# Patient Record
Sex: Female | Born: 1960
Health system: Southern US, Community
[De-identification: ages and names within clinical notes are randomized; demographics above are authoritative.]

## PROBLEM LIST (undated history)

## (undated) DIAGNOSIS — K5730 Diverticulosis of large intestine without perforation or abscess without bleeding: Secondary | ICD-10-CM

## (undated) DIAGNOSIS — Z860101 Personal history of adenomatous and serrated colon polyps: Secondary | ICD-10-CM

## (undated) DIAGNOSIS — Z86010 Personal history of colonic polyps: Secondary | ICD-10-CM

## (undated) DIAGNOSIS — M8580 Other specified disorders of bone density and structure, unspecified site: Secondary | ICD-10-CM

## (undated) DIAGNOSIS — T7840XA Allergy, unspecified, initial encounter: Secondary | ICD-10-CM

## (undated) DIAGNOSIS — R350 Frequency of micturition: Secondary | ICD-10-CM

## (undated) DIAGNOSIS — E785 Hyperlipidemia, unspecified: Secondary | ICD-10-CM

## (undated) DIAGNOSIS — Z8709 Personal history of other diseases of the respiratory system: Secondary | ICD-10-CM

## (undated) DIAGNOSIS — N393 Stress incontinence (female) (male): Secondary | ICD-10-CM

## (undated) DIAGNOSIS — E079 Disorder of thyroid, unspecified: Secondary | ICD-10-CM

## (undated) DIAGNOSIS — I10 Essential (primary) hypertension: Secondary | ICD-10-CM

## (undated) DIAGNOSIS — E039 Hypothyroidism, unspecified: Secondary | ICD-10-CM

## (undated) DIAGNOSIS — J45909 Unspecified asthma, uncomplicated: Secondary | ICD-10-CM

## (undated) HISTORY — DX: Other specified disorders of bone density and structure, unspecified site: M85.80

## (undated) HISTORY — DX: Unspecified asthma, uncomplicated: J45.909

## (undated) HISTORY — DX: Disorder of thyroid, unspecified: E07.9

## (undated) HISTORY — PX: LAPAROSCOPY: SHX197

## (undated) HISTORY — DX: Hyperlipidemia, unspecified: E78.5

## (undated) HISTORY — DX: Allergy, unspecified, initial encounter: T78.40XA

## (undated) HISTORY — PX: TONSILLECTOMY: SUR1361

## (undated) HISTORY — DX: Essential (primary) hypertension: I10

---
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
## 1997-11-16 HISTORY — PX: TOTAL ABDOMINAL HYSTERECTOMY W/ BILATERAL SALPINGOOPHORECTOMY: SHX83

## 1998-11-16 HISTORY — PX: ABDOMINAL HYSTERECTOMY: SHX81

## 2012-11-16 LAB — HM COLONOSCOPY

## 2014-07-17 LAB — HM PAP SMEAR: HM PAP: NORMAL

## 2014-07-17 LAB — HM MAMMOGRAPHY: HM Mammogram: NORMAL

## 2015-03-08 ENCOUNTER — Telehealth: Payer: Self-pay | Admitting: *Deleted

## 2015-03-08 ENCOUNTER — Encounter: Payer: Self-pay | Admitting: *Deleted

## 2015-03-08 NOTE — Telephone Encounter (Signed)
Pre-Visit Call completed with patient and chart updated.   Pre-Visit Info documented in Specialty Comments under SnapShot.    

## 2015-03-11 ENCOUNTER — Ambulatory Visit (INDEPENDENT_AMBULATORY_CARE_PROVIDER_SITE_OTHER): Payer: BLUE CROSS/BLUE SHIELD | Admitting: Family

## 2015-03-11 ENCOUNTER — Encounter: Payer: Self-pay | Admitting: Family

## 2015-03-11 VITALS — BP 122/82 | HR 79 | Temp 97.9°F | Resp 16 | Ht 61.0 in | Wt 164.0 lb

## 2015-03-11 DIAGNOSIS — Z0000 Encounter for general adult medical examination without abnormal findings: Secondary | ICD-10-CM

## 2015-03-11 DIAGNOSIS — Z23 Encounter for immunization: Secondary | ICD-10-CM

## 2015-03-11 NOTE — Patient Instructions (Addendum)
Please complete lab work prior to leaving. Follow up in 6 months for follow up of your blood pressure. Schedule mole removal at your convenience. Welcome to [facility]!

## 2015-03-11 NOTE — Progress Notes (Signed)
Pre visit review using our clinic review tool, if applicable. No additional management support is needed unless otherwise documented below in the visit note. 

## 2015-03-11 NOTE — Assessment & Plan Note (Signed)
Obtain routine lab work today. EKG, continue healthy diet, exercise. Follow up in 6 months.

## 2015-03-11 NOTE — Progress Notes (Signed)
Subjective:    Patient ID: Tara Hamilton, female    DOB: December 06, 1960, 54 y.o.   MRN: [id number]  HPI  Ms. Tara SheehanJenkins is a 54 yr old female who presents today to establish care.    Patient presents today for complete physical.  Immunizations: >10 years ago.  Due Diet:has gained 10 pounds since October (working from home) Exercise: gym- 1 hour on treadmill, some light weights 3 x a week, enjoys hiking Colonoscopy: 2014- was told diverticulosis, advised to eat more fiber.  Dexa: has not had- on HRT Pap Smear:hysterectomy Mammogram: 9/15 Dental: has apt Wednesday Vision- last eye exam 1/15  Review of Systems  Constitutional: Positive for unexpected weight change.  HENT: Negative for hearing loss and rhinorrhea.   Eyes: Negative for visual disturbance.  Respiratory: Negative for cough.   Cardiovascular: Negative for leg swelling.  Gastrointestinal: Negative for nausea, diarrhea, constipation and blood in stool.  Genitourinary: Negative for dysuria and frequency.       Stress incontinence  Musculoskeletal: Negative for myalgias and arthralgias.  Skin: Negative for rash.  Neurological:       Some allergy headaches  Hematological: Negative for adenopathy.  Psychiatric/Behavioral: Negative for dysphoric mood and agitation.   Past Medical History  Diagnosis Date  . Hypertension   . Allergy     History   Social History  . Marital Status: Divorced    Spouse Name: N/A  . Number of Children: N/A  . Years of Education: N/A   Occupational History  . Not on file.   Social History Main Topics  . Smoking status: Never Smoker   . Smokeless tobacco: Never Used  . Alcohol Use: 0.6 oz/week    1 Glasses of wine per week  . Drug Use: No  . Sexual Activity: Not on file   Other Topics Concern  . Not on file   Social History Narrative    Past Surgical History  Procedure Laterality Date  . Abdominal hysterectomy  2000  . Tonsillectomy      Family History  Problem  Relation Age of Onset  . Hypertension Mother   . Stroke Mother [id number]  . Hypertension Father   . Cancer Maternal Aunt 70    uterine  . Heart disease Paternal Grandmother     Allergies  Allergen Reactions  . Penicillins Swelling    Current Outpatient Prescriptions on File Prior to Visit  Medication Sig Dispense Refill  . Calcium Carbonate-Vitamin D (TGT CALCIUM DIETARY SUPPLEMENT PO) Take 1 tablet by mouth daily.    Marland Kitchen. estropipate (OGEN) 1.5 MG tablet Take 1.5 mg by mouth daily.    . hydrochlorothiazide (MICROZIDE) 12.5 MG capsule Take 12.5 mg by mouth daily.    . Multiple Vitamins-Minerals (MULTIVITAMIN ADULT PO) Take 1 tablet by mouth daily.     No current facility-administered medications on file prior to visit.    BP 122/82 mmHg  Pulse 79  Temp(Src) 97.9 F (36.6 C) (Oral)  Resp 16  Ht 5\' 1"  (1.549 m)  Wt 164 lb (74.39 kg)  BMI 31.00 kg/m2  SpO2 99%       Objective:   Physical Exam  Physical Exam  Constitutional: She is oriented to person, place, and time. She appears well-developed and well-nourished. No distress.  HENT:  Head: Normocephalic and atraumatic.  Right Ear: Tympanic membrane and ear canal normal.  Left Ear: Tympanic membrane and ear canal normal.  Mouth/Throat: Oropharynx is clear and moist.  Eyes: Pupils are equal, round, and  reactive to light. No scleral icterus.  Neck: Normal range of motion. No thyromegaly present.  Cardiovascular: Normal rate and regular rhythm.   No murmur heard. Pulmonary/Chest: Effort normal and breath sounds normal. No respiratory distress. He has no wheezes. She has no rales. She exhibits no tenderness.  Abdominal: Soft. Bowel sounds are normal. He exhibits no distension and no mass. There is no tenderness. There is no rebound and no guarding.  Musculoskeletal: She exhibits no edema.  Lymphadenopathy:    She has no cervical adenopathy.  Neurological: She is alert and oriented to person, place, and time. She has  normalpatellar reflexes. She exhibits normal muscle tone. Coordination normal.  Skin: Skin is warm and dry. raised rough small skin lesion right dorsal wrist. Tanned skin Psychiatric: She has a normal mood and affect. Her behavior is normal. Judgment and thought content normal.  Breasts: Examined lying Right: Without masses, retractions, discharge or axillary adenopathy.  Left: Without masses, retractions, discharge or axillary adenopathy.  Pelvic: deferred          Assessment & Plan:         Assessment & Plan:

## 2015-03-12 LAB — URINALYSIS, ROUTINE W REFLEX MICROSCOPIC
Bilirubin Urine: NEGATIVE
Hgb urine dipstick: NEGATIVE
Ketones, ur: NEGATIVE
Nitrite: NEGATIVE
PH: 7 (ref 5.0–8.0)
Specific Gravity, Urine: 1.015 (ref 1.000–1.030)
Total Protein, Urine: NEGATIVE
UROBILINOGEN UA: 0.2 (ref 0.0–1.0)
Urine Glucose: NEGATIVE

## 2015-03-12 LAB — LIPID PANEL
CHOLESTEROL: 223 mg/dL — AB (ref 0–200)
HDL: 72.5 mg/dL (ref >39.00)
LDL Cholesterol: 124 mg/dL — ABNORMAL HIGH (ref 0–99)
NonHDL: 150.5
Total CHOL/HDL Ratio: 3
Triglycerides: 132 mg/dL (ref 0.0–149.0)
VLDL: 26.4 mg/dL (ref 0.0–40.0)

## 2015-03-12 LAB — CBC WITH DIFFERENTIAL/PLATELET
BASOS ABS: 0 10*3/uL (ref 0.0–0.1)
Basophils Relative: 0.6 % (ref 0.0–3.0)
EOS ABS: 0 10*3/uL (ref 0.0–0.7)
Eosinophils Relative: 0.6 % (ref 0.0–5.0)
HEMATOCRIT: 42 % (ref 36.0–46.0)
Hemoglobin: 14.3 g/dL (ref 12.0–15.0)
Lymphocytes Relative: 27.4 % (ref 12.0–46.0)
Lymphs Abs: 1.9 10*3/uL (ref 0.7–4.0)
MCHC: 34.1 g/dL (ref 30.0–36.0)
MCV: 91.8 fl (ref 78.0–100.0)
MONO ABS: 0.4 10*3/uL (ref 0.1–1.0)
Monocytes Relative: 6 % (ref 3.0–12.0)
NEUTROS PCT: 65.4 % (ref 43.0–77.0)
Neutro Abs: 4.5 10*3/uL (ref 1.4–7.7)
Platelets: 316 10*3/uL (ref 150.0–400.0)
RBC: 4.58 Mil/uL (ref 3.87–5.11)
RDW: 12.7 % (ref 11.5–15.5)
WBC: 6.9 10*3/uL (ref 4.0–10.5)

## 2015-03-12 LAB — BASIC METABOLIC PANEL
BUN: 13 mg/dL (ref 6–23)
CO2: 30 mEq/L (ref 19–32)
Calcium: 10.4 mg/dL (ref 8.4–10.5)
Chloride: 101 mEq/L (ref 96–112)
Creatinine, Ser: 0.76 mg/dL (ref 0.40–1.20)
GFR: 84.75 mL/min (ref >60.00)
GLUCOSE: 63 mg/dL — AB (ref 70–99)
Potassium: 4.1 mEq/L (ref 3.5–5.1)
Sodium: 140 mEq/L (ref 135–145)

## 2015-03-12 LAB — HEPATIC FUNCTION PANEL
ALT: 16 U/L (ref 0–35)
AST: 20 U/L (ref 0–37)
Albumin: 4.5 g/dL (ref 3.5–5.2)
Alkaline Phosphatase: 73 U/L (ref 39–117)
BILIRUBIN DIRECT: 0 mg/dL (ref 0.0–0.3)
Total Bilirubin: 0.3 mg/dL (ref 0.2–1.2)
Total Protein: 7.1 g/dL (ref 6.0–8.3)

## 2015-03-12 LAB — TSH: TSH: 2.71 u[IU]/mL (ref 0.35–4.50)

## 2015-03-13 ENCOUNTER — Encounter: Payer: Self-pay | Admitting: Family

## 2015-03-14 MED ORDER — CIPROFLOXACIN HCL 500 MG PO TABS: 500.0000 mg | ORAL_TABLET | Freq: Two times a day (BID) | ORAL | Status: DC

## 2015-03-18 MED ORDER — CIPROFLOXACIN HCL 500 MG PO TABS: 500.0000 mg | ORAL_TABLET | Freq: Two times a day (BID) | ORAL | Status: DC

## 2015-03-18 NOTE — Addendum Note (Signed)
Addended by: Sandford Craze'SULLIVAN, Seferino Oscar on: 03/18/2015 02:51 PM   Modules accepted: Orders

## 2015-03-28 ENCOUNTER — Telehealth: Payer: Self-pay | Admitting: Family

## 2015-03-28 NOTE — Telephone Encounter (Signed)
Relation to pt: self Call back number: [phone]   Reason for call:  Pt would like a pap 03/29/15, 04/01/15, 04/02/15 or 04/16/15 due to insurance canceling on the 04/17/15.

## 2015-03-28 NOTE — Telephone Encounter (Signed)
Patient has had hysterectomy so Pap is not indicated.

## 2015-03-29 NOTE — Telephone Encounter (Signed)
Notified pt. Her current insurance will be expiring on 04/16/15. Pt would like to get 90 day supply of HCTZ and Ogen to [facility]. Pt states she was told at her last office visit that we would take over management of her HRT. Pt has 6 month follow up in 08/2015. Please advise.

## 2015-03-31 MED ORDER — ESTROPIPATE 1.5 MG PO TABS: 1.5000 mg | ORAL_TABLET | Freq: Every day | ORAL | Status: DC

## 2015-03-31 MED ORDER — HYDROCHLOROTHIAZIDE 12.5 MG PO CAPS: 12.5000 mg | ORAL_CAPSULE | Freq: Every day | ORAL | Status: DC

## 2015-08-20 ENCOUNTER — Encounter: Payer: Self-pay | Admitting: Family

## 2015-08-20 ENCOUNTER — Ambulatory Visit (INDEPENDENT_AMBULATORY_CARE_PROVIDER_SITE_OTHER): Payer: 59 | Admitting: Family

## 2015-08-20 VITALS — BP 130/89 | HR 71 | Temp 98.3°F | Resp 16 | Ht 61.0 in | Wt 162.8 lb

## 2015-08-20 DIAGNOSIS — Z0000 Encounter for general adult medical examination without abnormal findings: Secondary | ICD-10-CM

## 2015-08-20 DIAGNOSIS — I10 Essential (primary) hypertension: Secondary | ICD-10-CM | POA: Insufficient documentation

## 2015-08-20 DIAGNOSIS — N951 Menopausal and female climacteric states: Secondary | ICD-10-CM | POA: Insufficient documentation

## 2015-08-20 DIAGNOSIS — J4520 Mild intermittent asthma, uncomplicated: Secondary | ICD-10-CM

## 2015-08-20 DIAGNOSIS — J45909 Unspecified asthma, uncomplicated: Secondary | ICD-10-CM | POA: Insufficient documentation

## 2015-08-20 LAB — BASIC METABOLIC PANEL
BUN: 16 mg/dL (ref 6–23)
CHLORIDE: 101 meq/L (ref 96–112)
CO2: 33 mEq/L — ABNORMAL HIGH (ref 19–32)
Calcium: 9.8 mg/dL (ref 8.4–10.5)
Creatinine, Ser: 0.84 mg/dL (ref 0.40–1.20)
GFR: 75.38 mL/min (ref >60.00)
Glucose, Bld: 82 mg/dL (ref 70–99)
POTASSIUM: 3.6 meq/L (ref 3.5–5.1)
Sodium: 139 mEq/L (ref 135–145)

## 2015-08-20 MED ORDER — HYDROCHLOROTHIAZIDE 12.5 MG PO CAPS: 12.5000 mg | ORAL_CAPSULE | Freq: Every day | ORAL | Status: DC

## 2015-08-20 MED ORDER — ESTROPIPATE 1.5 MG PO TABS: 1.5000 mg | ORAL_TABLET | Freq: Every day | ORAL | Status: DC

## 2015-08-20 MED ORDER — AZELASTINE HCL 0.1 % NA SOLN: NASAL | Status: DC

## 2015-08-20 MED ORDER — ALBUTEROL SULFATE HFA 108 (90 BASE) MCG/ACT IN AERS
2.0000 | INHALATION_SPRAY | RESPIRATORY_TRACT | Status: DC | PRN
Start: 2015-08-20 — End: 2016-09-09

## 2015-08-20 NOTE — Progress Notes (Signed)
Subjective:    Patient ID: Tara Hamilton, female    DOB: 01-07-1961, 54 y.o.   MRN: [id number]  HPI    HTN- She is maintianed on HCTZ. Denies CP/SOB or swelling.   BP Readings from Last 3 Encounters:  08/20/15 130/89  03/11/15 122/82   Asthma- reports that she used albuterol a few weeks ago due to allergies.   HRT- continues estrogen therapy. Feels well on current dose.   Review of Systems See HPI  Past Medical History  Diagnosis Date  . Hypertension   . Allergy     Social History   Social History  . Marital Status: Divorced    Spouse Name: N/A  . Number of Children: N/A  . Years of Education: N/A   Occupational History  . Not on file.   Social History Main Topics  . Smoking status: Never Smoker   . Smokeless tobacco: Never Used  . Alcohol Use: 0.6 oz/week    1 Glasses of wine per week  . Drug Use: No  . Sexual Activity: Not on file   Other Topics Concern  . Not on file   Social History Narrative   From here, recently moved back from 20 years in [place].    Works as a IT consultant   2 grown sons one in [place], other son lives in [place]   Single    Enjoys Nutritional therapist, church, reading    Past Surgical History  Procedure Laterality Date  . Abdominal hysterectomy  2000  . Tonsillectomy      Family History  Problem Relation Age of Onset  . Hypertension Mother   . Stroke Mother 87  . Hypertension Father   . Cancer Maternal Aunt 70    uterine  . Heart disease Paternal Grandmother     Allergies  Allergen Reactions  . Penicillins Swelling    Current Outpatient Prescriptions on File Prior to Visit  Medication Sig Dispense Refill  . albuterol (PROVENTIL HFA;VENTOLIN HFA) 108 (90 BASE) MCG/ACT inhaler Inhale 2 puffs into the lungs every 4 (four) hours as needed (allergies).    . AZELASTINE HCL NA Place 0.1 % into the nose. Ust two sprays in each nostril twice a day as needed    . Calcium Carbonate-Vitamin D (TGT CALCIUM DIETARY  SUPPLEMENT PO) Take 1 tablet by mouth daily.    Marland Kitchen estropipate (OGEN) 1.5 MG tablet Take 1 tablet (1.5 mg total) by mouth daily. 90 tablet 1  . fexofenadine (ALLEGRA) 180 MG tablet Take 180 mg by mouth daily.    . hydrochlorothiazide (MICROZIDE) 12.5 MG capsule Take 1 capsule (12.5 mg total) by mouth daily. 90 capsule 1  . Multiple Vitamins-Minerals (MULTIVITAMIN ADULT PO) Take 1 tablet by mouth daily.     No current facility-administered medications on file prior to visit.    BP 130/89 mmHg  Pulse 71  Temp(Src) 98.3 F (36.8 C) (Oral)  Resp 16  Ht  (1.549 m)  Wt 162 lb 12.8 oz (73.846 kg)  BMI 30.78 kg/m2  SpO2 98%       Objective:   Physical Exam  Constitutional: She is oriented to person, place, and time. She appears well-developed and well-nourished.  Cardiovascular: Normal rate, regular rhythm and normal heart sounds.   No murmur heard. Pulmonary/Chest: Effort normal and breath sounds normal. No respiratory distress. She has no wheezes.  Musculoskeletal: She exhibits no edema.  Neurological: She is alert and oriented to person, place, and time.  Psychiatric:  She has a normal mood and affect. Her behavior is normal. Judgment and thought content normal.          Assessment & Plan:  Will get flu shot on Tuesday at work.

## 2015-08-20 NOTE — Assessment & Plan Note (Signed)
Stable on ogen, continue same.

## 2015-08-20 NOTE — Progress Notes (Signed)
Pre visit review using our clinic review tool, if applicable. No additional management support is needed unless otherwise documented below in the visit note. 

## 2015-08-20 NOTE — Assessment & Plan Note (Signed)
Stable on hctz, continue same, obtain bmet.  

## 2015-08-20 NOTE — Patient Instructions (Addendum)
Please complete lab work prior to leaving. Follow up in 6 months for annual physical. (after 03/11/15)

## 2015-08-20 NOTE — Assessment & Plan Note (Signed)
Stable with prn use of albuterol.  

## 2015-09-05 ENCOUNTER — Ambulatory Visit: Payer: 59

## 2015-09-11 ENCOUNTER — Ambulatory Visit: Payer: BLUE CROSS/BLUE SHIELD | Admitting: Family

## 2015-09-26 ENCOUNTER — Ambulatory Visit
Admission: RE | Admit: 2015-09-26 | Discharge: 2015-09-26 | Disposition: A | Discharge status: Home / Self Care | Payer: 59 | Source: Ambulatory Visit | Attending: Family | Admitting: Family

## 2015-09-26 DIAGNOSIS — Z0000 Encounter for general adult medical examination without abnormal findings: Secondary | ICD-10-CM

## 2016-03-11 ENCOUNTER — Other Ambulatory Visit (INDEPENDENT_AMBULATORY_CARE_PROVIDER_SITE_OTHER): Payer: 59

## 2016-03-11 ENCOUNTER — Ambulatory Visit (INDEPENDENT_AMBULATORY_CARE_PROVIDER_SITE_OTHER): Payer: 59 | Admitting: Family

## 2016-03-11 ENCOUNTER — Encounter: Payer: Self-pay | Admitting: Family

## 2016-03-11 VITALS — BP 132/84 | Ht 61.0 in | Wt 159.4 lb

## 2016-03-11 DIAGNOSIS — Z0000 Encounter for general adult medical examination without abnormal findings: Secondary | ICD-10-CM

## 2016-03-11 DIAGNOSIS — E039 Hypothyroidism, unspecified: Secondary | ICD-10-CM

## 2016-03-11 LAB — HEPATIC FUNCTION PANEL
ALT: 12 U/L (ref 0–35)
AST: 15 U/L (ref 0–37)
Albumin: 4.2 g/dL (ref 3.5–5.2)
Alkaline Phosphatase: 63 U/L (ref 39–117)
BILIRUBIN DIRECT: 0.1 mg/dL (ref 0.0–0.3)
BILIRUBIN TOTAL: 0.3 mg/dL (ref 0.2–1.2)
Total Protein: 6.8 g/dL (ref 6.0–8.3)

## 2016-03-11 LAB — CBC WITH DIFFERENTIAL/PLATELET
Basophils Absolute: 0 10*3/uL (ref 0.0–0.1)
Basophils Relative: 0.4 % (ref 0.0–3.0)
EOS ABS: 0.1 10*3/uL (ref 0.0–0.7)
Eosinophils Relative: 1.2 % (ref 0.0–5.0)
HCT: 40 % (ref 36.0–46.0)
HEMOGLOBIN: 13.5 g/dL (ref 12.0–15.0)
LYMPHS ABS: 1.4 10*3/uL (ref 0.7–4.0)
Lymphocytes Relative: 28.3 % (ref 12.0–46.0)
MCHC: 33.7 g/dL (ref 30.0–36.0)
MCV: 92.7 fl (ref 78.0–100.0)
MONO ABS: 0.4 10*3/uL (ref 0.1–1.0)
Monocytes Relative: 8 % (ref 3.0–12.0)
NEUTROS PCT: 62.1 % (ref 43.0–77.0)
Neutro Abs: 3 10*3/uL (ref 1.4–7.7)
Platelets: 300 10*3/uL (ref 150.0–400.0)
RBC: 4.32 Mil/uL (ref 3.87–5.11)
RDW: 12.2 % (ref 11.5–15.5)
WBC: 4.9 10*3/uL (ref 4.0–10.5)

## 2016-03-11 LAB — URINALYSIS, ROUTINE W REFLEX MICROSCOPIC
BILIRUBIN URINE: NEGATIVE
HGB URINE DIPSTICK: NEGATIVE
Ketones, ur: NEGATIVE
Nitrite: NEGATIVE
Specific Gravity, Urine: 1.02 (ref 1.000–1.030)
TOTAL PROTEIN, URINE-UPE24: NEGATIVE
Urine Glucose: NEGATIVE
Urobilinogen, UA: 0.2 (ref 0.0–1.0)
pH: 6 (ref 5.0–8.0)

## 2016-03-11 LAB — BASIC METABOLIC PANEL
BUN: 24 mg/dL — AB (ref 6–23)
CALCIUM: 9.8 mg/dL (ref 8.4–10.5)
CO2: 31 mEq/L (ref 19–32)
CREATININE: 0.83 mg/dL (ref 0.40–1.20)
Chloride: 103 mEq/L (ref 96–112)
GFR: 76.27 mL/min (ref >60.00)
GLUCOSE: 103 mg/dL — AB (ref 70–99)
Potassium: 5 mEq/L (ref 3.5–5.1)
Sodium: 141 mEq/L (ref 135–145)

## 2016-03-11 LAB — LIPID PANEL
CHOL/HDL RATIO: 3
CHOLESTEROL: 171 mg/dL (ref 0–200)
HDL: 56.1 mg/dL (ref >39.00)
LDL CALC: 99 mg/dL (ref 0–99)
NONHDL: 114.41
TRIGLYCERIDES: 79 mg/dL (ref 0.0–149.0)
VLDL: 15.8 mg/dL (ref 0.0–40.0)

## 2016-03-11 LAB — TSH: TSH: 4.58 u[IU]/mL — ABNORMAL HIGH (ref 0.35–4.50)

## 2016-03-11 LAB — T3, FREE: T3, Free: 9.1 pg/mL — ABNORMAL HIGH (ref 2.3–4.2)

## 2016-03-11 LAB — T4, FREE: Free T4: 2.49 ng/dL — ABNORMAL HIGH (ref 0.60–1.60)

## 2016-03-11 MED ORDER — HYDROCHLOROTHIAZIDE 12.5 MG PO CAPS: 12.5000 mg | ORAL_CAPSULE | Freq: Every day | ORAL | Status: DC

## 2016-03-11 MED ORDER — ESTROPIPATE 1.5 MG PO TABS: 1.5000 mg | ORAL_TABLET | Freq: Every day | ORAL | Status: DC

## 2016-03-11 NOTE — Addendum Note (Signed)
Addended by: Mervin KungFERGERSON, Sahas Sluka A on: 03/11/2016 08:19 AM   Modules accepted: Orders

## 2016-03-11 NOTE — Assessment & Plan Note (Addendum)
Discussed increasing exercise. Obtain routine lab work.  Immunizations reviewed and up to date. Advised pt to schedule mammo in November.

## 2016-03-11 NOTE — Progress Notes (Signed)
Subjective:    Patient ID: Tara Hamilton, female    DOB: [birth year], 55 y.o.   MRN: [id number]  HPI  Patient presents today for complete physical.  Immunizations: tetanus up to date Diet: reports healthy diet Exercise: not exercising Colonoscopy: 2014 Pap Smear: hysterectomy Mammogram: 11/16 Dental: up to date Vision: due  She is seeing Dr. Darrelyn Hamilton for low back pain using duexis sparingly  Review of Systems  Constitutional: Negative for unexpected weight change.  HENT: Positive for postnasal drip. Negative for hearing loss.   Eyes: Negative for visual disturbance.  Respiratory: Negative for cough.   Cardiovascular: Negative for leg swelling.  Gastrointestinal: Negative for diarrhea and constipation.  Genitourinary: Negative for dysuria and frequency.  Musculoskeletal: Positive for back pain. Negative for myalgias and arthralgias.  Skin: Negative for rash.  Neurological: Negative for headaches.  Hematological: Negative for adenopathy.  Psychiatric/Behavioral:       Denies depression/anxiety   Past Medical History  Diagnosis Date  . Hypertension   . Allergy      Social History   Social History  . Marital Status: Divorced    Spouse Name: N/A  . Number of Children: N/A  . Years of Education: N/A   Occupational History  . Not on file.   Social History Main Topics  . Smoking status: Never Smoker   . Smokeless tobacco: Never Used  . Alcohol Use: 0.6 oz/week    1 Glasses of wine per week  . Drug Use: No  . Sexual Activity: Not on file   Other Topics Concern  . Not on file   Social History Narrative   From here, recently moved back from 20 years in [place].    Works as a IT consultant   2 grown sons one in [place], other son lives in [place]   Single    Enjoys Nutritional therapist, church, reading    Past Surgical History  Procedure Laterality Date  . Abdominal hysterectomy  2000  . Tonsillectomy      Family History  Problem Relation Age of Onset   . Hypertension Mother   . Stroke Mother 59  . Hypertension Father   . Cancer Maternal Aunt 70    uterine  . Heart disease Paternal Grandmother     Allergies  Allergen Reactions  . Penicillins Swelling    Current Outpatient Prescriptions on File Prior to Visit  Medication Sig Dispense Refill  . albuterol (PROVENTIL HFA;VENTOLIN HFA) 108 (90 BASE) MCG/ACT inhaler Inhale 2 puffs into the lungs every 4 (four) hours as needed (allergies). 1 Inhaler 3  . azelastine (ASTELIN) 0.1 % nasal spray Ust two sprays in each nostril twice a day as needed 90 mL 1  . Calcium Carbonate-Vitamin D (TGT CALCIUM DIETARY SUPPLEMENT PO) Take 1 tablet by mouth daily.    Marland Kitchen estropipate (OGEN) 1.5 MG tablet Take 1 tablet (1.5 mg total) by mouth daily. 90 tablet 1  . fexofenadine (ALLEGRA) 180 MG tablet Take 180 mg by mouth daily.    . hydrochlorothiazide (MICROZIDE) 12.5 MG capsule Take 1 capsule (12.5 mg total) by mouth daily. 90 capsule 1  . Multiple Vitamins-Minerals (MULTIVITAMIN ADULT PO) Take 1 tablet by mouth daily.     No current facility-administered medications on file prior to visit.    Ht  (1.549 m)  Wt 159 lb 6.4 oz (72.303 kg)  BMI 30.13 kg/m2       Objective:   Physical Exam  Physical Exam  Constitutional: She is  oriented to person, place, and time. She appears well-developed and well-nourished. No distress.  HENT:  Head: Normocephalic and atraumatic.  Right Ear: Tympanic membrane and ear canal normal.  Left Ear: Tympanic membrane and ear canal normal.  Mouth/Throat: Oropharynx is clear and moist.  Eyes: Pupils are equal, round, and reactive to light. No scleral icterus.  Neck: Normal range of motion. No thyromegaly present.  Cardiovascular: Normal rate and regular rhythm.   No murmur heard. Pulmonary/Chest: Effort normal and breath sounds normal. No respiratory distress. He has no wheezes. She has no rales. She exhibits no tenderness.  Abdominal: Soft. Bowel sounds are  normal. He exhibits no distension and no mass. There is no tenderness. There is no rebound and no guarding.  Musculoskeletal: She exhibits no edema.  Lymphadenopathy:    She has no cervical adenopathy.  Neurological: She is alert and oriented to person, place, and time. She has normal patellar reflexes. She exhibits normal muscle tone. Coordination normal.  Skin: Skin is warm and dry.  Psychiatric: She has a normal mood and affect. Her behavior is normal. Judgment and thought content normal.  Breasts: Examined lying Right: Without masses, retractions, discharge or axillary adenopathy.  Left: Without masses, retractions, discharge or axillary adenopathy.          Assessment & Plan:        Assessment & Plan:  EKG tracing is personally reviewed.  EKG notes NSR.  No acute changes.

## 2016-03-11 NOTE — Progress Notes (Signed)
Pre visit review using our clinic review tool, if applicable. No additional management support is needed unless otherwise documented below in the visit note. 

## 2016-03-11 NOTE — Patient Instructions (Signed)
Please complete lab work prior to leaving. Try to add 30 minutes of walking 5 days a week. Contact the breast center in [place] to schedule your mammogram in November. Schedule a routine eye exam.

## 2016-03-12 ENCOUNTER — Encounter: Payer: Self-pay | Admitting: Family

## 2016-03-12 ENCOUNTER — Other Ambulatory Visit: Payer: Self-pay

## 2016-03-12 ENCOUNTER — Telehealth: Payer: Self-pay | Admitting: Family

## 2016-03-12 DIAGNOSIS — E038 Other specified hypothyroidism: Secondary | ICD-10-CM

## 2016-03-12 NOTE — Telephone Encounter (Signed)
Please let pt know that her thyroid function is mildly abnormal.  I would like to repeat her TSH, free T3, free T4 in 6 weeks dx hypothyroid.  Cholesterol is improved, blood count is normal. Urine shows possible UTI.  Would only treat if she is having symptoms- burnining frequency etc.

## 2016-03-12 NOTE — Telephone Encounter (Signed)
Pt notified and made aware.  She states understanding and agrees with plan.  Lab appt scheduled.  Future labs ordered in Orders only encounter.

## 2016-03-13 MED ORDER — NITROFURANTOIN MONOHYD MACRO 100 MG PO CAPS: 100.0000 mg | ORAL_CAPSULE | Freq: Two times a day (BID) | ORAL | Status: DC

## 2016-03-23 ENCOUNTER — Telehealth: Payer: Self-pay | Admitting: Family

## 2016-03-23 NOTE — Telephone Encounter (Signed)
Received fax from [facility] for estropipate. Refill form completed and forwarded to PCP for signature.

## 2016-03-25 ENCOUNTER — Other Ambulatory Visit: Payer: Self-pay | Admitting: *Deleted

## 2016-03-25 MED ORDER — ESTROPIPATE 1.5 MG PO TABS: 1.5000 mg | ORAL_TABLET | Freq: Every day | ORAL | Status: DC

## 2016-03-25 NOTE — Telephone Encounter (Signed)
Received fax from [facility] requesting refills of Estropipate. Form signed for #90, refills x 1 year and faxed to [phone].

## 2016-03-25 NOTE — Telephone Encounter (Signed)
Rx faxed

## 2016-04-23 ENCOUNTER — Other Ambulatory Visit: Payer: 59

## 2016-07-23 ENCOUNTER — Telehealth: Payer: 59 | Admitting: Physician Assistant

## 2016-07-23 DIAGNOSIS — R399 Unspecified symptoms and signs involving the genitourinary system: Secondary | ICD-10-CM

## 2016-07-23 MED ORDER — NITROFURANTOIN MONOHYD MACRO 100 MG PO CAPS: 100.0000 mg | ORAL_CAPSULE | Freq: Two times a day (BID) | ORAL | 0 refills | Status: DC

## 2016-07-23 NOTE — Progress Notes (Signed)

## 2016-08-28 ENCOUNTER — Telehealth: Payer: 59 | Admitting: Family

## 2016-08-28 DIAGNOSIS — W57XXXA Bitten or stung by nonvenomous insect and other nonvenomous arthropods, initial encounter: Secondary | ICD-10-CM

## 2016-08-28 MED ORDER — PREDNISONE 5 MG PO TABS: 5.0000 mg | ORAL_TABLET | ORAL | 0 refills | Status: DC

## 2016-08-28 NOTE — Progress Notes (Signed)
E Visit for Insect Sting  Thank you for describing the insect sting for [place].  Here is how we plan to help!  A sting that we will treat with a short course of prednisone.  If it continues to become more red, severely painful, changes drastically in color, or begins to open up as a sore, please be seen face-to-face. At this point, it is difficult to tell if it is simply inflammation or an infection brewing. We always treat the inflammation in this case because it does not look severe. However, if it continues to worsen, it may indicate infection and we would want to consider antibiotics.   The 2 greatest risks from insect stings are allergic reaction, which can be fatal in some people and infection, which is more common and less serious.  Bees, wasps, yellow jackets, and hornets belong to a class of insects called Hymenoptera.  Most insect stings cause only minor discomfort.  Stings can happen anywhere on the body and can be painful.  Most stings are from honey bees or yellow jackets.  Fire ants can sting multiple times.  The sites of the stings are more likely to become infected.    I have sent in prednisone 10 mg tapering dose for 5 days to the pharmacy you selected.  Please make sure that you selected a pharmacy that is open now.  What can be used to prevent Insect Stings?   Insect repellant with at least 20% DEET.    Wearing long pants and shirts with socks and shoes.    Wear dark or drab-colored clothes rather than bright colors.    Avoid using perfumes and hair sprays; these attract insects.  HOME CARE ADVICE:  1. Stinger removal:  The stinger looks like a tiny black dot in the sting.  Use a fingernail, credit card edge, or knife-edge to scrape it off.  Don't pull it out because it squeezes out more venom.  If the stinger is below the skin surface, leave it alone.  It will be shed with normal skin healing. 2. Use cold compresses to the area of the sting for 10-20 minutes.   You may repeat this as needed to relieve symptoms of pain and swelling. 3.  For pain relief, take acetominophen 650 mg 4-6 hours as needed or ibuprofen 400 mg every 6-8 hours as needed or naproxen 250-500 mg every 12 hours as needed. 4.  You can also use hydrocortisone cream 0.5% or 1% up to 4 times daily as needed for itching. 5.  If the sting becomes very itchy, take Benadryl 25-50 mg, follow directions on box. 6.  Wash the area 2-3 times daily with antibacterial soap and warm water. 7. Call your Doctor if:  Fever, a severe headache, or rash occur in the next 2 weeks.  Sting area begins to look infected.  Redness and swelling worsens after home treatment.  Your current symptoms become worse.    MAKE SURE YOU:   Understand these instructions.  Will watch your condition.  Will get help right away if you are not doing well or get worse.  Thank you for choosing an e-visit. Your e-visit answers were reviewed by a board certified advanced clinical practitioner to complete your personal care plan. Depending upon the condition, your plan could have included both over the counter or prescription medications. Please review your pharmacy choice. Be sure that the pharmacy you have chosen is open so that you can pick up your prescription now.  If  there is a problem you may message your provider in MyChart to have the prescription routed to another pharmacy. Your safety is important to us. If you have drug allergies check your prescription carefully.  For the next 24 hours, you can use MyChart to ask questions about today's visit, request a non-urgent call back, or ask for a work or school excuse from your e-visit provider. You will get an email in the next two days asking about your experience. I hope that your e-visit has been valuable and will speed your recovery.

## 2016-08-28 NOTE — Progress Notes (Signed)
Because you have reactions to prednisone, the center for disease control recommends that we monitor you closely for the next two weeks.  No other antibiotic/med has been approved for prophylaxis.

## 2016-09-09 ENCOUNTER — Encounter: Payer: Self-pay | Admitting: Family

## 2016-09-09 ENCOUNTER — Ambulatory Visit (INDEPENDENT_AMBULATORY_CARE_PROVIDER_SITE_OTHER): Payer: Managed Care, Other (non HMO) | Admitting: Family

## 2016-09-09 VITALS — BP 120/82 | HR 79 | Resp 16 | Ht 61.0 in | Wt 160.0 lb

## 2016-09-09 DIAGNOSIS — N951 Menopausal and female climacteric states: Secondary | ICD-10-CM | POA: Diagnosis not present

## 2016-09-09 DIAGNOSIS — J309 Allergic rhinitis, unspecified: Secondary | ICD-10-CM | POA: Insufficient documentation

## 2016-09-09 DIAGNOSIS — I10 Essential (primary) hypertension: Secondary | ICD-10-CM | POA: Diagnosis not present

## 2016-09-09 DIAGNOSIS — R32 Unspecified urinary incontinence: Secondary | ICD-10-CM

## 2016-09-09 LAB — BASIC METABOLIC PANEL
BUN: 18 mg/dL (ref 6–23)
CHLORIDE: 100 meq/L (ref 96–112)
CO2: 31 mEq/L (ref 19–32)
CREATININE: 0.85 mg/dL (ref 0.40–1.20)
Calcium: 9.9 mg/dL (ref 8.4–10.5)
GFR: 74.06 mL/min (ref >60.00)
GLUCOSE: 90 mg/dL (ref 70–99)
POTASSIUM: 3.6 meq/L (ref 3.5–5.1)
Sodium: 140 mEq/L (ref 135–145)

## 2016-09-09 NOTE — Assessment & Plan Note (Signed)
Stable on ogen, continue same.

## 2016-09-09 NOTE — Assessment & Plan Note (Signed)
We discussed referral to GYN.  She declines at this time, wishes to avoid surgery.

## 2016-09-09 NOTE — Assessment & Plan Note (Addendum)
Stable on hctz, continue same.  

## 2016-09-09 NOTE — Progress Notes (Signed)
Subjective:    Patient ID: Tara DuffelSandra H Hamilton, female    DOB: Oct 16, 1961, 55 y.o.   MRN: [id number]  HPI  Tara Hamilton is a 55 yr old female who presents today for follow up.  1) HTN- maintained on hctz 12.5mg .   BP Readings from Last 3 Encounters:  09/09/16 120/82  03/11/16 132/84  08/20/15 130/89   2)  menopausal syndrome-  Maintained on ogen.    3) allergic rhinitis- stable prn use of allegra.  4) bladder leakage-  She does report that she has had 2 UTI's this year.  Also has to wear a pad daily.  Has stopped doing zumba and other similar exercises due to the leakage.   Review of Systems See HPI  Past Medical History:  Diagnosis Date  . Allergy   . Hypertension      Social History   Social History  . Marital status: Divorced    Spouse name: N/A  . Number of children: N/A  . Years of education: N/A   Occupational History  . Not on file.   Social History Main Topics  . Smoking status: Never Smoker  . Smokeless tobacco: Never Used  . Alcohol use 0.6 oz/week    1 Glasses of wine per week  . Drug use: No  . Sexual activity: Not on file   Other Topics Concern  . Not on file   Social History Narrative   From here, recently moved back from 20 years in [place].    Works as a IT consultantparalegal   2 grown sons one in [place], other son lives in [place]   Single    Enjoys Nutritional therapistmaking jewelry, church, reading    Past Surgical History:  Procedure Laterality Date  . ABDOMINAL HYSTERECTOMY  2000  . TONSILLECTOMY      Family History  Problem Relation Age of Onset  . Hypertension Mother   . Stroke Mother 2174  . Hypertension Father   . Cancer Maternal Aunt 70    uterine  . Heart disease Paternal Grandmother     Allergies  Allergen Reactions  . Penicillins Swelling    Current Outpatient Prescriptions on File Prior to Visit  Medication Sig Dispense Refill  . Calcium Carbonate-Vitamin D (TGT CALCIUM DIETARY SUPPLEMENT PO) Take 1 tablet by mouth daily.    .  DUEXIS 800-26.6 MG TABS Take 1 tablet by mouth 3 (three) times daily.  1  . estropipate (OGEN) 1.5 MG tablet Take 1 tablet (1.5 mg total) by mouth daily. 90 tablet 3  . fexofenadine (ALLEGRA) 180 MG tablet Take 180 mg by mouth daily.    . hydrochlorothiazide (MICROZIDE) 12.5 MG capsule Take 1 capsule (12.5 mg total) by mouth daily. 90 capsule 3  . Multiple Vitamins-Minerals (MULTIVITAMIN ADULT PO) Take 1 tablet by mouth daily.     No current facility-administered medications on file prior to visit.     BP 120/82 (BP Location: Right Arm, Cuff Size: Normal)   Pulse 79   Resp 16   Ht 5\' 1"  (1.549 m)   Wt 160 lb (72.6 kg)   SpO2 98% Comment: room air  BMI 30.23 kg/m       Objective:   Physical Exam  Constitutional: She is oriented to person, place, and time. She appears well-developed and well-nourished.  HENT:  Head: Normocephalic and atraumatic.  Cardiovascular: Normal rate, regular rhythm and normal heart sounds.   No murmur heard. Pulmonary/Chest: Effort normal and breath sounds normal. No respiratory distress.  She has no wheezes.  Musculoskeletal: She exhibits no edema.  Neurological: She is alert and oriented to person, place, and time.  Psychiatric: She has a normal mood and affect. Her behavior is normal. Judgment and thought content normal.          Assessment & Plan:

## 2016-09-09 NOTE — Patient Instructions (Signed)
Please complete blood work prior to leaving.  

## 2016-09-09 NOTE — Progress Notes (Signed)
Pre visit review using our clinic review tool, if applicable. No additional management support is needed unless otherwise documented below in the visit note. 

## 2016-09-09 NOTE — Assessment & Plan Note (Signed)
Stable with prn use of allegra, continue same.

## 2016-10-07 ENCOUNTER — Other Ambulatory Visit: Payer: Self-pay | Admitting: Family

## 2016-10-07 DIAGNOSIS — Z1231 Encounter for screening mammogram for malignant neoplasm of breast: Secondary | ICD-10-CM

## 2016-10-22 ENCOUNTER — Other Ambulatory Visit: Payer: Self-pay | Admitting: *Deleted

## 2016-10-22 ENCOUNTER — Encounter: Payer: Self-pay | Admitting: Family

## 2016-10-22 MED ORDER — ESTROPIPATE 1.5 MG PO TABS: 1.5000 mg | ORAL_TABLET | Freq: Every day | ORAL | 1 refills | Status: DC

## 2016-10-22 MED ORDER — HYDROCHLOROTHIAZIDE 12.5 MG PO CAPS
12.5000 mg | ORAL_CAPSULE | Freq: Every day | ORAL | 1 refills | Status: DC
Start: 2016-10-22 — End: 2017-05-08

## 2016-10-22 MED ORDER — HYDROCHLOROTHIAZIDE 12.5 MG PO CAPS: 12.5000 mg | ORAL_CAPSULE | Freq: Every day | ORAL | 1 refills | Status: DC

## 2016-10-25 ENCOUNTER — Other Ambulatory Visit: Payer: Self-pay | Admitting: Family

## 2016-10-29 ENCOUNTER — Ambulatory Visit
Admission: RE | Admit: 2016-10-29 | Discharge: 2016-10-29 | Disposition: A | Discharge status: Home / Self Care | Payer: Managed Care, Other (non HMO) | Source: Ambulatory Visit | Attending: Family | Admitting: Family

## 2016-10-29 DIAGNOSIS — Z1231 Encounter for screening mammogram for malignant neoplasm of breast: Secondary | ICD-10-CM

## 2017-03-10 ENCOUNTER — Encounter: Payer: Self-pay | Admitting: Family

## 2017-03-10 ENCOUNTER — Ambulatory Visit (INDEPENDENT_AMBULATORY_CARE_PROVIDER_SITE_OTHER): Payer: Managed Care, Other (non HMO) | Admitting: Family

## 2017-03-10 VITALS — BP 124/86 | HR 77 | Temp 98.5°F | Resp 16 | Ht 61.0 in | Wt 155.4 lb

## 2017-03-10 DIAGNOSIS — Z0000 Encounter for general adult medical examination without abnormal findings: Secondary | ICD-10-CM | POA: Diagnosis not present

## 2017-03-10 LAB — URINALYSIS, ROUTINE W REFLEX MICROSCOPIC
Bilirubin Urine: NEGATIVE
HGB URINE DIPSTICK: NEGATIVE
Ketones, ur: NEGATIVE
NITRITE: NEGATIVE
RBC / HPF: NONE SEEN (ref 0–?)
SPECIFIC GRAVITY, URINE: 1.015 (ref 1.000–1.030)
Total Protein, Urine: NEGATIVE
Urine Glucose: NEGATIVE
Urobilinogen, UA: 0.2 (ref 0.0–1.0)
pH: 7 (ref 5.0–8.0)

## 2017-03-10 LAB — HEPATIC FUNCTION PANEL
ALT: 12 U/L (ref 0–35)
AST: 15 U/L (ref 0–37)
Albumin: 4.4 g/dL (ref 3.5–5.2)
Alkaline Phosphatase: 65 U/L (ref 39–117)
BILIRUBIN TOTAL: 0.3 mg/dL (ref 0.2–1.2)
Bilirubin, Direct: 0 mg/dL (ref 0.0–0.3)
TOTAL PROTEIN: 6.9 g/dL (ref 6.0–8.3)

## 2017-03-10 LAB — CBC WITH DIFFERENTIAL/PLATELET
BASOS PCT: 0.8 % (ref 0.0–3.0)
Basophils Absolute: 0 10*3/uL (ref 0.0–0.1)
EOS PCT: 0.7 % (ref 0.0–5.0)
Eosinophils Absolute: 0 10*3/uL (ref 0.0–0.7)
HEMATOCRIT: 42.4 % (ref 36.0–46.0)
HEMOGLOBIN: 14.3 g/dL (ref 12.0–15.0)
LYMPHS PCT: 32.4 % (ref 12.0–46.0)
Lymphs Abs: 1.7 10*3/uL (ref 0.7–4.0)
MCHC: 33.7 g/dL (ref 30.0–36.0)
MCV: 93.5 fl (ref 78.0–100.0)
MONO ABS: 0.3 10*3/uL (ref 0.1–1.0)
MONOS PCT: 6.4 % (ref 3.0–12.0)
Neutro Abs: 3.2 10*3/uL (ref 1.4–7.7)
Neutrophils Relative %: 59.7 % (ref 43.0–77.0)
Platelets: 300 10*3/uL (ref 150.0–400.0)
RBC: 4.53 Mil/uL (ref 3.87–5.11)
RDW: 12.4 % (ref 11.5–15.5)
WBC: 5.3 10*3/uL (ref 4.0–10.5)

## 2017-03-10 LAB — TSH: TSH: 3.85 u[IU]/mL (ref 0.35–4.50)

## 2017-03-10 LAB — LIPID PANEL
CHOL/HDL RATIO: 3
Cholesterol: 216 mg/dL — ABNORMAL HIGH (ref 0–200)
HDL: 68.2 mg/dL (ref >39.00)
LDL CALC: 132 mg/dL — AB (ref 0–99)
NonHDL: 148.29
TRIGLYCERIDES: 82 mg/dL (ref 0.0–149.0)
VLDL: 16.4 mg/dL (ref 0.0–40.0)

## 2017-03-10 LAB — BASIC METABOLIC PANEL
BUN: 26 mg/dL — ABNORMAL HIGH (ref 6–23)
CHLORIDE: 100 meq/L (ref 96–112)
CO2: 30 mEq/L (ref 19–32)
Calcium: 9.9 mg/dL (ref 8.4–10.5)
Creatinine, Ser: 0.84 mg/dL (ref 0.40–1.20)
GFR: 74.94 mL/min (ref >60.00)
Glucose, Bld: 89 mg/dL (ref 70–99)
POTASSIUM: 3.7 meq/L (ref 3.5–5.1)
SODIUM: 138 meq/L (ref 135–145)

## 2017-03-10 NOTE — Patient Instructions (Addendum)
Please call [employer] and check if Shingrix (shingles vaccine) is covered and also bone density. Please complete lab work prior to leaving. Continue healthy diet and exercise.

## 2017-03-10 NOTE — Progress Notes (Signed)
Subjective:    Patient ID: Tara Hamilton, female    DOB: 05-10-1961, 56 y.o.   MRN: [id number]  HPI  Patient presents today for complete physical.  Immunizations: tetanus up to date Diet:eats healthy Wt Readings from Last 3 Encounters:  03/10/17 155 lb 6.4 oz (70.5 kg)  09/09/16 160 lb (72.6 kg)  03/11/16 159 lb 6.4 oz (72.3 kg)  Exercise: walks Colonoscopy: 2014 (normal per patient)  Dexa: due (will check with insurance due to high deductible) Pap Smear: hysterectomy Mammogram: 12/17     Review of Systems  Constitutional: Negative for unexpected weight change.  HENT: Negative for hearing loss and rhinorrhea.   Eyes: Negative for visual disturbance.  Respiratory: Negative for cough.   Cardiovascular: Negative for leg swelling.  Gastrointestinal: Negative for blood in stool, constipation and diarrhea.  Genitourinary: Negative for dysuria, frequency and hematuria.  Musculoskeletal: Negative for arthralgias and myalgias.  Skin: Negative for rash.  Neurological: Negative for headaches.  Hematological: Negative for adenopathy.  Psychiatric/Behavioral:       Denies depression/anxiety   Past Medical History:  Diagnosis Date  . Allergy   . Hypertension      Social History   Social History  . Marital status: Divorced    Spouse name: N/A  . Number of children: N/A  . Years of education: N/A   Occupational History  . Not on file.   Social History Main Topics  . Smoking status: Never Smoker  . Smokeless tobacco: Never Used  . Alcohol use No  . Drug use: No  . Sexual activity: Not on file   Other Topics Concern  . Not on file   Social History Narrative   From here, recently moved back from 20 years in [place].    Works as a IT consultant   2 grown sons one in [place], other son lives in [place]   Single    Enjoys Nutritional therapist, church, reading    Past Surgical History:  Procedure Laterality Date  . ABDOMINAL HYSTERECTOMY  2000  . TONSILLECTOMY       Family History  Problem Relation Age of Onset  . Hypertension Mother   . Stroke Mother 71  . Hypertension Father   . Cancer Maternal Aunt 70    uterine  . Heart disease Paternal Grandmother     Allergies  Allergen Reactions  . Penicillins Swelling    Current Outpatient Prescriptions on File Prior to Visit  Medication Sig Dispense Refill  . Calcium Carbonate-Vitamin D (TGT CALCIUM DIETARY SUPPLEMENT PO) Take 1 tablet by mouth daily.    Marland Kitchen estropipate (OGEN) 1.5 MG tablet TAKE ONE TABLET BY MOUTH ONCE DAILY 90 tablet 1  . fexofenadine (ALLEGRA) 180 MG tablet Take 180 mg by mouth daily.    . hydrochlorothiazide (MICROZIDE) 12.5 MG capsule Take 1 capsule (12.5 mg total) by mouth daily. 90 capsule 1  . Multiple Vitamins-Minerals (MULTIVITAMIN ADULT PO) Take 1 tablet by mouth daily.     No current facility-administered medications on file prior to visit.     BP 124/86 (BP Location: Right Arm, Cuff Size: Normal)   Pulse 77   Temp 98.5 F (36.9 C) (Oral)   Resp 16   Ht  (1.549 m)   Wt 155 lb 6.4 oz (70.5 kg)   SpO2 100% Comment: room air  BMI 29.36 kg/m       Objective:   Physical Exam Physical Exam  ConstituCORRINNA KARAPETYANs oriented to person, place, and  time. She appears well-developed and well-nourished. No distress.  HENT:  Head: Normocephalic and atraumatic.  Right Ear: Tympanic membrane and ear canal normal.  Left Ear: Tympanic membrane and ear canal normal.  Mouth/Throat: Oropharynx is clear and moist.  Eyes: Pupils are equal, round, and reactive to light. No scleral icterus.  Neck: Normal range of motion. No thyromegaly present.  Cardiovascular: Normal rate and regular rhythm.   No murmur heard. Pulmonary/Chest: Effort normal and breath sounds normal. No respiratory distress. He has no wheezes. She has no rales. She exhibits no tenderness.  Abdominal: Soft. Bowel sounds are normal. She exhibits no distension and no mass. There is no tenderness. There is  no rebound and no guarding.  Musculoskeletal: She exhibits no edema.  Lymphadenopathy:    She has no cervical adenopathy.  Neurological: She is alert and oriented to person, place, and time. She has normal patellar reflexes. She exhibits normal muscle tone. Coordination normal.  Skin: Skin is warm and dry.  Psychiatric: She has a normal mood and affect. Her behavior is normal. Judgment and thought content normal.  Breasts: Examined lying Right: Without masses, retractions, discharge or axillary adenopathy.  Left: Without masses, retractions, discharge or axillary adenopathy.  Pelvic: deferred         Assessment & Plan:        Assessment & Plan:  Preventative care- discussed healthy diet and exercise. EKG tracing is personally reviewed.  EKG notes NSR.  No acute changes. Mammogram up to date. She will check with her insurance re: shingrix and dexa coverage and let me know if she wants to proceed. Obtain routine lab work.

## 2017-03-10 NOTE — Progress Notes (Signed)
Pre visit review using our clinic review tool, if applicable. No additional management support is needed unless otherwise documented below in the visit note. 

## 2017-03-11 NOTE — Telephone Encounter (Signed)
Could you please ask lab to add on hep C?  Z0.00.

## 2017-03-12 ENCOUNTER — Encounter: Payer: Self-pay | Admitting: Family

## 2017-03-15 MED ORDER — NITROFURANTOIN MONOHYD MACRO 100 MG PO CAPS: 100.0000 mg | ORAL_CAPSULE | Freq: Two times a day (BID) | ORAL | 0 refills | Status: DC

## 2017-05-08 ENCOUNTER — Other Ambulatory Visit: Payer: Self-pay | Admitting: Family

## 2017-05-10 NOTE — Telephone Encounter (Signed)
Refill sent per [facility] refill protocol/SLS  

## 2017-05-13 ENCOUNTER — Other Ambulatory Visit: Payer: Self-pay | Admitting: Family

## 2017-05-13 ENCOUNTER — Encounter: Payer: Self-pay | Admitting: Family

## 2017-05-13 MED ORDER — ESTRADIOL 0.5 MG PO TABS: 0.5000 mg | ORAL_TABLET | Freq: Every day | ORAL | 1 refills | Status: DC

## 2017-05-19 ENCOUNTER — Other Ambulatory Visit: Payer: Self-pay | Admitting: Family

## 2017-05-20 NOTE — Telephone Encounter (Signed)
Notified pt that she will need evaluation in the office first and she states she is unable to come in today and is really unsure if she has an infection and will wait another day or so before scheduling an appointment. Advised her to call if symptoms worsen or she can come in today otherwise she can go to urgent care ([facility]) this eveing if that will work best for her. Pt voices understanding.

## 2017-07-09 ENCOUNTER — Telehealth: Payer: Self-pay | Admitting: Family

## 2017-07-09 NOTE — Telephone Encounter (Signed)
[facility] - [phone]   Called in because they received bone density orders with the diagnosis of preventative care. She said that they can not complete with that diagnosis and need to have it changed.   Please assist so that they can schedule pt.

## 2017-07-12 NOTE — Telephone Encounter (Signed)
[name]-- I spoke with Fabby at the [facility]. She states they cannot bill test as preventative because it is a diagnostic test and they need a specific code (reason for doing the test). They still request that we change diagnosis and reorder test. I have reached out to [facility] and am waiting on her reply.

## 2017-07-13 NOTE — Telephone Encounter (Signed)
Dunn, thanks for getting back to [place]. So as I understand it she would need to have already completed a bone density in order for me to determine her fracture risk assessment score. She has not had that. She has also not had fracture to my knowledge. Therefore I do not think I can put her in a high risk category. Do you interpret this to mean that I cannot put this through as a screening and therefore she would not qualify for bone density under cigna Guidelines?

## 2017-07-13 NOTE — Telephone Encounter (Signed)
Hey, I am sending additional info I found on [place] via email.  I am reading now.  Let me know if you want to discuss over phone.    Thanks,  [place]

## 2017-07-13 NOTE — Telephone Encounter (Signed)
[name]-- see response from [facility] and advise?  These are the guidelines for [employer]ign[employer]: In order for them to cover. (see below) Does she have a high fracture risk?   Osteoporosis Screening: women age 55 or older (or younger women with fracture risk as determined by Fracture Risk Assessment Score)  [phone], [phone], [phone], [phone], G0130  Select Designated Wellness Code from [employer]  (77078 MAY require precertification or other reasonable medical management technique or practice depending on benefit plan design)  Thanks,  [employer]

## 2017-07-14 NOTE — Telephone Encounter (Signed)
Attempted to reach pt. # not in service. Sent response via [employer].

## 2017-07-14 NOTE — Telephone Encounter (Signed)
Please contact patient and let her know that I reviewed the CIG[employer] guidelines for bone density coverage. Their guidelines state that they will only cover preventive screening bone density for women under the age of 55 if they have an increased risk of fracture. At this time we do not have any reason to believe that she is at high risk for fracture more than an average woman her age. At this point,  I think if she wishes to proceed with bone density it would likely be an out-of-pocket expense.

## 2017-07-16 NOTE — Telephone Encounter (Signed)
See email from pt stating she will wait until she is [id number]. DEXA order cancelled.

## 2017-08-13 ENCOUNTER — Encounter: Payer: Self-pay | Admitting: Family

## 2017-09-08 ENCOUNTER — Ambulatory Visit (INDEPENDENT_AMBULATORY_CARE_PROVIDER_SITE_OTHER): Payer: BLUE CROSS/BLUE SHIELD | Admitting: Family

## 2017-09-08 ENCOUNTER — Encounter: Payer: Self-pay | Admitting: Family

## 2017-09-08 VITALS — BP 117/85 | HR 68 | Temp 97.9°F | Resp 18 | Ht 61.0 in | Wt 160.0 lb

## 2017-09-08 DIAGNOSIS — I10 Essential (primary) hypertension: Secondary | ICD-10-CM

## 2017-09-08 DIAGNOSIS — R300 Dysuria: Secondary | ICD-10-CM | POA: Diagnosis not present

## 2017-09-08 DIAGNOSIS — N3000 Acute cystitis without hematuria: Secondary | ICD-10-CM | POA: Diagnosis not present

## 2017-09-08 LAB — POC URINALSYSI DIPSTICK (AUTOMATED)
BILIRUBIN UA: NEGATIVE
Blood, UA: NEGATIVE
GLUCOSE UA: NEGATIVE
KETONES UA: NEGATIVE
NITRITE UA: NEGATIVE
PH UA: 6 (ref 5.0–8.0)
Protein, UA: NEGATIVE
Spec Grav, UA: 1.015 (ref 1.010–1.025)
Urobilinogen, UA: NEGATIVE E.U./dL — AB

## 2017-09-08 LAB — BASIC METABOLIC PANEL
BUN: 20 mg/dL (ref 6–23)
CHLORIDE: 101 meq/L (ref 96–112)
CO2: 34 meq/L — AB (ref 19–32)
Calcium: 10.2 mg/dL (ref 8.4–10.5)
Creatinine, Ser: 0.84 mg/dL (ref 0.40–1.20)
GFR: 74.8 mL/min (ref >60.00)
GLUCOSE: 103 mg/dL — AB (ref 70–99)
POTASSIUM: 4.6 meq/L (ref 3.5–5.1)
Sodium: 142 mEq/L (ref 135–145)

## 2017-09-08 MED ORDER — NITROFURANTOIN MONOHYD MACRO 100 MG PO CAPS
100.0000 mg | ORAL_CAPSULE | Freq: Two times a day (BID) | ORAL | 0 refills | Status: DC
Start: 2017-09-08 — End: 2018-03-09

## 2017-09-08 MED FILL — NITROFURANTOIN MONO-MCR 100: 100 | 5 days supply | Qty: 10 | Fill #0

## 2017-09-08 NOTE — Progress Notes (Signed)
Subjective:    Patient ID: Tara Hamilton, female    DOB: Dec 03, 1960, 56 y.o.   MRN: [id number]  HPI  Tara Hamilton is a 56 yr old female who presents today for follow up.  HTN- she is maintained on microzide 12.5mg  once daily.  BP Readings from Last 3 Encounters:  09/08/17 117/85  03/10/17 124/86  09/09/16 120/82   Dysuria- reports dysuria x 2 days. She denies frequency.  Denies fever or hematuria or low back pain.  Denies urinary odor.     Review of Systems See HPI  Past Medical History:  Diagnosis Date  . Allergy   . Hypertension      Social History   Social History  . Marital status: Divorced    Spouse name: N/A  . Number of children: N/A  . Years of education: N/A   Occupational History  . Not on file.   Social History Main Topics  . Smoking status: Never Smoker  . Smokeless tobacco: Never Used  . Alcohol use No  . Drug use: No  . Sexual activity: Not on file   Other Topics Concern  . Not on file   Social History Narrative   From here, recently moved back from 20 years in [place].    Works as a IT consultantparalegal   2 grown sons one in [place], other son lives in [place]   Single    Enjoys Nutritional therapistmaking jewelry, church, reading    Past Surgical History:  Procedure Laterality Date  . ABDOMINAL HYSTERECTOMY  2000  . TONSILLECTOMY      Family History  Problem Relation Age of Onset  . Hypertension Mother   . Stroke Mother 3074  . Hypertension Father   . Cancer Maternal Aunt 70       uterine  . Heart disease Paternal Grandmother     Allergies  Allergen Reactions  . Penicillins Swelling    Current Outpatient Prescriptions on File Prior to Visit  Medication Sig Dispense Refill  . Calcium Carbonate-Vitamin D (TGT CALCIUM DIETARY SUPPLEMENT PO) Take 1 tablet by mouth daily.    Marland Kitchen. estradiol (ESTRACE) 0.5 MG tablet Take 1 tablet (0.5 mg total) by mouth daily. 90 tablet 1  . fexofenadine (ALLEGRA) 180 MG tablet Take 180 mg by mouth daily.    .  hydrochlorothiazide (MICROZIDE) 12.5 MG capsule TAKE ONE CAPSULE BY MOUTH ONCE DAILY 90 capsule 1  . Multiple Vitamins-Minerals (MULTIVITAMIN ADULT PO) Take 1 tablet by mouth daily.     No current facility-administered medications on file prior to visit.     BP 117/85 (BP Location: Right Arm, Cuff Size: Normal)   Pulse 68   Temp 97.9 F (36.6 C) (Oral)   Resp 18   Ht 5\' 1"  (1.549 m)   Wt 160 lb (72.6 kg)   SpO2 100%   BMI 30.23 kg/m       Objective:   Physical Exam  Constitutional: She is oriented to person, place, and time. She appears well-developed and well-nourished.  HENT:  Head: Normocephalic.  Cardiovascular: Normal rate, regular rhythm and normal heart sounds.   No murmur heard. Pulmonary/Chest: Effort normal and breath sounds normal. No respiratory distress. She has no wheezes.  Abdominal: Soft. She exhibits no distension. There is no tenderness. There is no CVA tenderness.  Musculoskeletal: She exhibits no edema.  Neurological: She is alert and oriented to person, place, and time.  Psychiatric: She has a normal mood and affect. Her behavior is normal. Judgment  and thought content normal.          Assessment & Plan:  UTI- UA shows 1+ leuks. Will send for culture and will begin macrobid.

## 2017-09-08 NOTE — Patient Instructions (Addendum)
Please complete lab work prior to leaving.  Please call if symptoms worsen or if they do not improve.

## 2017-09-08 NOTE — Assessment & Plan Note (Signed)
bp is stable on current dose of hctz.  Continue same.  Obtain follow up bmet.

## 2017-09-09 LAB — URINE CULTURE
MICRO NUMBER:: 81190582
RESULT: NO GROWTH
SPECIMEN QUALITY: ADEQUATE

## 2017-10-13 ENCOUNTER — Other Ambulatory Visit: Payer: Self-pay | Admitting: Family

## 2017-10-18 ENCOUNTER — Other Ambulatory Visit: Payer: Self-pay | Admitting: Family

## 2017-10-18 ENCOUNTER — Encounter: Payer: Self-pay | Admitting: Family

## 2017-10-18 DIAGNOSIS — Z1231 Encounter for screening mammogram for malignant neoplasm of breast: Secondary | ICD-10-CM

## 2017-10-18 MED ORDER — ESTRADIOL 0.5 MG PO TABS: 0.5000 mg | ORAL_TABLET | Freq: Every day | ORAL | 1 refills | Status: DC

## 2017-10-18 MED ORDER — HYDROCHLOROTHIAZIDE 12.5 MG PO CAPS: 12.5000 mg | ORAL_CAPSULE | Freq: Every day | ORAL | 1 refills | Status: DC

## 2017-10-18 NOTE — Addendum Note (Signed)
Addended by: Wilford CornerSIERRA, Naome Brigandi M on: 10/18/2017 02:10 PM   Modules accepted: Orders

## 2017-11-18 ENCOUNTER — Ambulatory Visit
Admission: RE | Admit: 2017-11-18 | Discharge: 2017-11-18 | Disposition: A | Discharge status: Home / Self Care | Payer: BLUE CROSS/BLUE SHIELD | Source: Ambulatory Visit | Attending: Family | Admitting: Family

## 2017-11-18 DIAGNOSIS — Z1231 Encounter for screening mammogram for malignant neoplasm of breast: Secondary | ICD-10-CM

## 2018-03-09 ENCOUNTER — Other Ambulatory Visit: Payer: Self-pay | Admitting: Family

## 2018-03-09 ENCOUNTER — Telehealth: Payer: Self-pay | Admitting: Family

## 2018-03-09 ENCOUNTER — Ambulatory Visit (INDEPENDENT_AMBULATORY_CARE_PROVIDER_SITE_OTHER): Payer: BLUE CROSS/BLUE SHIELD | Admitting: Family

## 2018-03-09 ENCOUNTER — Encounter: Payer: Self-pay | Admitting: Family

## 2018-03-09 VITALS — BP 110/80 | HR 95 | Temp 98.8°F | Ht 61.0 in | Wt 158.0 lb

## 2018-03-09 DIAGNOSIS — J209 Acute bronchitis, unspecified: Secondary | ICD-10-CM

## 2018-03-09 MED ORDER — ESTRADIOL 0.5 MG PO TABS: 0.5000 mg | ORAL_TABLET | Freq: Every day | ORAL | 0 refills | Status: DC

## 2018-03-09 MED ORDER — HYDROCHLOROTHIAZIDE 12.5 MG PO CAPS: 12.5000 mg | ORAL_CAPSULE | Freq: Every day | ORAL | 0 refills | Status: DC

## 2018-03-09 MED ORDER — DOXYCYCLINE HYCLATE 100 MG PO TABS: 100.0000 mg | ORAL_TABLET | Freq: Two times a day (BID) | ORAL | 0 refills | Status: DC

## 2018-03-09 NOTE — Telephone Encounter (Signed)
Please let her know that I did refill her medications as requested for 90 days; her PCP was fine with refills. Hope she feels better soon-

## 2018-03-09 NOTE — Telephone Encounter (Signed)
Spoke with patient today and she is all set.

## 2018-03-09 NOTE — Progress Notes (Signed)
  Tara DuffelSandra H Hamilton is a 57 y.o. female with the following history as recorded in EpicCare:  Patient Active Problem List   Diagnosis Date Noted  . Urinary incontinence 09/09/2016  . Allergic rhinitis 09/09/2016  . HTN (hypertension) 08/20/2015  . Menopausal syndrome 08/20/2015  . Asthma 08/20/2015  . Preventative health care 03/11/2015    Current Outpatient Medications  Medication Sig Dispense Refill  . Calcium Carbonate-Vitamin D (TGT CALCIUM DIETARY SUPPLEMENT PO) Take 1 tablet by mouth daily.    Marland Kitchen. Dextromethorphan-guaiFENesin (TUSSIN DM) 10-100 MG/5ML liquid Take by mouth.    . estradiol (ESTRACE) 0.5 MG tablet Take 1 tablet (0.5 mg total) by mouth daily. 90 tablet 1  . fexofenadine (ALLEGRA) 180 MG tablet Take 180 mg by mouth daily.    . hydrochlorothiazide (MICROZIDE) 12.5 MG capsule Take 1 capsule (12.5 mg total) by mouth daily. 90 capsule 1  . Multiple Vitamins-Minerals (MULTIVITAMIN ADULT PO) Take 1 tablet by mouth daily.    Marland Kitchen. doxycycline (VIBRA-TABS) 100 MG tablet Take 1 tablet (100 mg total) by mouth 2 (two) times daily. 20 tablet 0   No current facility-administered medications for this visit.     Allergies: Penicillins  Past Medical History:  Diagnosis Date  . Allergy   . Hypertension     Past Surgical History:  Procedure Laterality Date  . ABDOMINAL HYSTERECTOMY  2000  . TONSILLECTOMY      Family History  Problem Relation Age of Onset  . Hypertension Mother   . Stroke Mother [id number]  . Hypertension Father   . Cancer Maternal Aunt 70       uterine  . Heart disease Paternal Grandmother     Social History   Tobacco Use  . Smoking status: Never Smoker  . Smokeless tobacco: Never Used  Substance Use Topics  . Alcohol use: No    Alcohol/week: 0.0 oz    Subjective:  Patient presents with concerns for sinus infection/ chest infection x 1 week; + Allegra/ Robitussin DM; + up at night coughing; does hear "rattling" in her chest;   Objective:  Vitals:   03/09/18  0953  BP: 110/80  Pulse: 95  Temp: 98.8 F (37.1 C)  TempSrc: Oral  SpO2: 96%  Weight: 158 lb 0.6 oz (71.7 kg)  Height: 5\' 1"  (1.549 m)    General: Well developed, well nourished, in no acute distress  Skin : Warm and dry.  Head: Normocephalic and atraumatic  Eyes: Sclera and conjunctiva clear; pupils round and reactive to light; extraocular movements intact  Ears: External normal; canals clear; tympanic membranes normal  Oropharynx: Pink, supple. No suspicious lesions  Neck: Supple without thyromegaly, adenopathy  Lungs: Respirations unlabored; wheezing noted in upper labs;  CVS exam: normal rate and regular rhythm.  Neurologic: Alert and oriented; speech intact; face symmetrical; moves all extremities well; CNII-XII intact without focal deficit  Assessment:  1. Acute bronchitis, unspecified organism     Plan:  Rx for Doxycycline 100 mg bid x 10 days; sample of Symbicort 160/4.5 2 puffs bid; increase fluids, rest; follow-up worse, no better;  No follow-ups on file.  No orders of the defined types were placed in this encounter.   Requested Prescriptions   Signed Prescriptions Disp Refills  . doxycycline (VIBRA-TABS) 100 MG tablet 20 tablet 0    Sig: Take 1 tablet (100 mg total) by mouth 2 (two) times daily.

## 2018-03-15 ENCOUNTER — Encounter: Payer: BLUE CROSS/BLUE SHIELD | Admitting: Family

## 2018-05-03 ENCOUNTER — Encounter: Payer: Self-pay | Admitting: Family

## 2018-05-03 ENCOUNTER — Encounter: Payer: BLUE CROSS/BLUE SHIELD | Admitting: Family

## 2018-05-03 ENCOUNTER — Ambulatory Visit (INDEPENDENT_AMBULATORY_CARE_PROVIDER_SITE_OTHER): Payer: BLUE CROSS/BLUE SHIELD | Admitting: Family

## 2018-05-03 VITALS — BP 124/84 | HR 71 | Temp 97.8°F | Resp 16 | Ht 61.0 in | Wt 158.2 lb

## 2018-05-03 DIAGNOSIS — Z0000 Encounter for general adult medical examination without abnormal findings: Secondary | ICD-10-CM | POA: Diagnosis not present

## 2018-05-03 DIAGNOSIS — E348 Other specified endocrine disorders: Secondary | ICD-10-CM

## 2018-05-03 DIAGNOSIS — R300 Dysuria: Secondary | ICD-10-CM

## 2018-05-03 DIAGNOSIS — E039 Hypothyroidism, unspecified: Secondary | ICD-10-CM

## 2018-05-03 LAB — BASIC METABOLIC PANEL
BUN: 24 mg/dL — AB (ref 6–23)
CALCIUM: 10.3 mg/dL (ref 8.4–10.5)
CO2: 31 mEq/L (ref 19–32)
CREATININE: 0.98 mg/dL (ref 0.40–1.20)
Chloride: 102 mEq/L (ref 96–112)
GFR: 62.46 mL/min (ref >60.00)
GLUCOSE: 101 mg/dL — AB (ref 70–99)
POTASSIUM: 4.6 meq/L (ref 3.5–5.1)
Sodium: 142 mEq/L (ref 135–145)

## 2018-05-03 LAB — URINALYSIS, ROUTINE W REFLEX MICROSCOPIC
BILIRUBIN URINE: NEGATIVE
Hgb urine dipstick: NEGATIVE
KETONES UR: NEGATIVE
Leukocytes, UA: NEGATIVE
NITRITE: NEGATIVE
RBC / HPF: NONE SEEN (ref 0–?)
Specific Gravity, Urine: 1.01 (ref 1.000–1.030)
Total Protein, Urine: NEGATIVE
Urine Glucose: NEGATIVE
Urobilinogen, UA: 0.2 (ref 0.0–1.0)
pH: 7 (ref 5.0–8.0)

## 2018-05-03 LAB — CBC WITH DIFFERENTIAL/PLATELET
BASOS ABS: 0.1 10*3/uL (ref 0.0–0.1)
Basophils Relative: 1.1 % (ref 0.0–3.0)
EOS ABS: 0.1 10*3/uL (ref 0.0–0.7)
EOS PCT: 1.3 % (ref 0.0–5.0)
HCT: 39.2 % (ref 36.0–46.0)
HEMOGLOBIN: 13.4 g/dL (ref 12.0–15.0)
Lymphocytes Relative: 33.6 % (ref 12.0–46.0)
Lymphs Abs: 1.6 10*3/uL (ref 0.7–4.0)
MCHC: 34 g/dL (ref 30.0–36.0)
MCV: 94.5 fl (ref 78.0–100.0)
MONO ABS: 0.3 10*3/uL (ref 0.1–1.0)
Monocytes Relative: 7.1 % (ref 3.0–12.0)
NEUTROS PCT: 56.9 % (ref 43.0–77.0)
Neutro Abs: 2.7 10*3/uL (ref 1.4–7.7)
Platelets: 276 10*3/uL (ref 150.0–400.0)
RBC: 4.15 Mil/uL (ref 3.87–5.11)
RDW: 12.7 % (ref 11.5–15.5)
WBC: 4.8 10*3/uL (ref 4.0–10.5)

## 2018-05-03 LAB — HEPATIC FUNCTION PANEL
ALT: 14 U/L (ref 0–35)
AST: 14 U/L (ref 0–37)
Albumin: 4.4 g/dL (ref 3.5–5.2)
Alkaline Phosphatase: 54 U/L (ref 39–117)
BILIRUBIN DIRECT: 0.1 mg/dL (ref 0.0–0.3)
BILIRUBIN TOTAL: 0.4 mg/dL (ref 0.2–1.2)
Total Protein: 6.5 g/dL (ref 6.0–8.3)

## 2018-05-03 LAB — LIPID PANEL
CHOL/HDL RATIO: 3
CHOLESTEROL: 219 mg/dL — AB (ref 0–200)
HDL: 67.2 mg/dL (ref >39.00)
LDL CALC: 127 mg/dL — AB (ref 0–99)
NonHDL: 151.43
Triglycerides: 123 mg/dL (ref 0.0–149.0)
VLDL: 24.6 mg/dL (ref 0.0–40.0)

## 2018-05-03 LAB — TSH: TSH: 5.38 u[IU]/mL — AB (ref 0.35–4.50)

## 2018-05-03 MED ORDER — HYDROCHLOROTHIAZIDE 12.5 MG PO CAPS: 12.5000 mg | ORAL_CAPSULE | Freq: Every day | ORAL | 2 refills | Status: DC

## 2018-05-03 MED ORDER — ESTRADIOL 0.5 MG PO TABS: 0.5000 mg | ORAL_TABLET | Freq: Every day | ORAL | 2 refills | Status: DC

## 2018-05-03 NOTE — Patient Instructions (Signed)
Please complete lab work prior to leaving.  Continue your work on healthy diet, exercise and weight loss.  

## 2018-05-03 NOTE — Progress Notes (Addendum)
Subjective:    Patient ID: Tara Hamilton, female    DOB: 29-May-1961, 57 y.o.   MRN: [id number]  HPI   Patient presents today for complete physical.  Immunizations: shingrix/tdap up to date Diet: diet could be better Exercise: walks [id number] steps a day Colonoscopy: 11/16/12 Dexa: due Pap Smear:  hysterectomy Mammogram:   11/18/17 Vision:  6/19 Dental: 6/19 Wt Readings from Last 3 Encounters:  05/03/18 158 lb 3.2 oz (71.8 kg)  03/09/18 158 lb 0.6 oz (71.7 kg)  09/08/17 160 lb (72.6 kg)         Review of Systems  Constitutional: Negative for unexpected weight change.  HENT: Negative for hearing loss and rhinorrhea.   Eyes: Negative for visual disturbance.  Respiratory: Negative for cough and shortness of breath.   Cardiovascular: Negative for chest pain and leg swelling.  Gastrointestinal: Negative for blood in stool, diarrhea, nausea and vomiting.  Genitourinary: Positive for dysuria. Negative for frequency.  Musculoskeletal: Negative for arthralgias and myalgias.       Mild sciatica, stretching/yoga helps  Skin: Negative for rash.  Neurological: Negative for headaches.  Hematological: Negative for adenopathy.  Psychiatric/Behavioral:       Denies depression/anxiety     Past Medical History:  Diagnosis Date  . Allergy   . Hypertension      Social History   Socioeconomic History  . Marital status: Divorced    Spouse name: Not on file  . Number of children: Not on file  . Years of education: Not on file  . Highest education level: Not on file  Occupational History  . Not on file  Social Needs  . Financial resource strain: Not on file  . Food insecurity:    Worry: Not on file    Inability: Not on file  . Transportation needs:    Medical: Not on file    Non-medical: Not on file  Tobacco Use  . Smoking status: Never Smoker  . Smokeless tobacco: Never Used  Substance and Sexual Activity  . Alcohol use: No    Alcohol/week: 0.0 oz  . Drug use: No  .  Sexual activity: Not on file  Lifestyle  . Physical activity:    Days per week: Not on file    Minutes per session: Not on file  . Stress: Not on file  Relationships  . Social connections:    Talks on phone: Not on file    Gets together: Not on file    Attends religious service: Not on file    Active member of club or organization: Not on file    Attends meetings of clubs or organizations: Not on file    Relationship status: Not on file  . Intimate partner violence:    Fear of current or ex partner: Not on file    Emotionally abused: Not on file    Physically abused: Not on file    Forced sexual activity: Not on file  Other Topics Concern  . Not on file  Social History Narrative   From here, recently moved back from 20 years in [place].    Works as a IT consultant   2 grown sons one in [place], other son lives in [place]   Single    Enjoys Nutritional therapist, church, reading    Past Surgical History:  Procedure Laterality Date  . ABDOMINAL HYSTERECTOMY  2000  . TONSILLECTOMY      Family History  Problem Relation Age of Onset  . Hypertension  Mother   . Stroke Mother 3474  . Hypertension Father   . Cancer Maternal Aunt 70       uterine  . Heart disease Paternal Grandmother     Allergies  Allergen Reactions  . Penicillins Swelling    Current Outpatient Medications on File Prior to Visit  Medication Sig Dispense Refill  . Calcium Carbonate-Vitamin D (TGT CALCIUM DIETARY SUPPLEMENT PO) Take 1 tablet by mouth daily.    Marland Kitchen. estradiol (ESTRACE) 0.5 MG tablet Take 1 tablet (0.5 mg total) by mouth daily. 90 tablet 0  . fexofenadine (ALLEGRA) 180 MG tablet Take 180 mg by mouth daily.    . hydrochlorothiazide (MICROZIDE) 12.5 MG capsule Take 1 capsule (12.5 mg total) by mouth daily. 90 capsule 0  . Multiple Vitamins-Minerals (MULTIVITAMIN ADULT PO) Take 1 tablet by mouth daily.     No current facility-administered medications on file prior to visit.     BP 124/84 (BP  Location: Right Arm, Patient Position: Sitting, Cuff Size: Small)   Pulse 71   Temp 97.8 F (36.6 C) (Oral)   Resp 16   Ht 5\' 1"  (1.549 m)   Wt 158 lb 3.2 oz (71.8 kg)   SpO2 100%   BMI 29.89 kg/m        Objective:   Physical Exam Physical Exam  Constitutional: She is oriented to person, place, and time. She appears well-developed and well-nourished. No distress.  HENT:  Head: Normocephalic and atraumatic.  Right Ear: Tympanic membrane and ear canal normal.  Left Ear: Tympanic membrane and ear canal normal.  Mouth/Throat: Oropharynx is clear and moist.  Eyes: Pupils are equal, round, and reactive to light. No scleral icterus.  Neck: Normal range of motion. No thyromegaly present.  Cardiovascular: Normal rate and regular rhythm.   No murmur heard. Pulmonary/Chest: Effort normal and breath sounds normal. No respiratory distress. He has no wheezes. She has no rales. She exhibits no tenderness.  Abdominal: Soft. Bowel sounds are normal. She exhibits no distension and no mass. There is no tenderness. There is no rebound and no guarding.  Musculoskeletal: She exhibits no edema.  Lymphadenopathy:    She has no cervical adenopathy.  Neurological: She is alert and oriented to person, place, and time. She has normal patellar reflexes. She exhibits normal muscle tone. Coordination normal.  Skin: Skin is warm and dry.  Psychiatric: She has a normal mood and affect. Her behavior is normal. Judgment and thought content normal.  Breasts: Examined lying Right: Without masses, retractions, discharge or axillary adenopathy.  Left: Without masses, retractions, discharge or axillary adenopathy.  Pelvic: deferred       Assessment & Plan:  Preventative care- discussed healthy diet, exercise, weight loss. Mammo, immunizations. Due for dexa but she wants to confirm coverage from insurance first and will let me know if she wishes to proceed. Obtain routine lab work.         Assessment & Plan:   EKG tracing is personally reviewed.  EKG notes NSR.  No acute changes.

## 2018-05-03 NOTE — Addendum Note (Signed)
Addended by: Sandford Craze'SULLIVAN, Arianah Torgeson on: 05/03/2018 08:04 AM   Modules accepted: Orders

## 2018-05-04 LAB — URINE CULTURE
MICRO NUMBER: 90727997
RESULT: NO GROWTH
SPECIMEN QUALITY:: ADEQUATE

## 2018-05-05 ENCOUNTER — Telehealth: Payer: Self-pay | Admitting: Family

## 2018-05-05 ENCOUNTER — Encounter: Payer: Self-pay | Admitting: Family

## 2018-05-05 DIAGNOSIS — E039 Hypothyroidism, unspecified: Secondary | ICD-10-CM

## 2018-05-05 NOTE — Telephone Encounter (Signed)
to please contact the lab and asked him to add free T3 and free T4, diagnosis hypothyroid.

## 2018-05-09 DIAGNOSIS — E039 Hypothyroidism, unspecified: Secondary | ICD-10-CM | POA: Diagnosis not present

## 2018-05-09 NOTE — Telephone Encounter (Signed)
Lab unable to add at inhouse lab but can send specimen to [facility] for additional testing. They need us to place orders in the last office visit. Orders entered and lab has been notified.

## 2018-05-09 NOTE — Telephone Encounter (Signed)
Add on form faxed to the lab. 

## 2018-05-09 NOTE — Addendum Note (Signed)
Addended by: Mervin KungFERGERSON, Otis Portal A on: 05/09/2018 12:10 PM   Modules accepted: Orders

## 2018-05-10 LAB — T3, FREE: T3 FREE: 3.1 pg/mL (ref 2.3–4.2)

## 2018-05-10 LAB — T4, FREE: FREE T4: 1.1 ng/dL (ref 0.8–1.8)

## 2018-06-14 ENCOUNTER — Other Ambulatory Visit (INDEPENDENT_AMBULATORY_CARE_PROVIDER_SITE_OTHER): Payer: BLUE CROSS/BLUE SHIELD

## 2018-06-14 DIAGNOSIS — E039 Hypothyroidism, unspecified: Secondary | ICD-10-CM

## 2018-06-14 LAB — T4, FREE: FREE T4: 1.9 ng/dL — AB (ref 0.60–1.60)

## 2018-06-14 LAB — TSH: TSH: 7.84 u[IU]/mL — ABNORMAL HIGH (ref 0.35–4.50)

## 2018-06-14 LAB — T3, FREE: T3, Free: 8 pg/mL — ABNORMAL HIGH (ref 2.3–4.2)

## 2018-06-15 ENCOUNTER — Telehealth: Payer: Self-pay | Admitting: Family

## 2018-06-15 DIAGNOSIS — R946 Abnormal results of thyroid function studies: Secondary | ICD-10-CM

## 2018-06-15 NOTE — Telephone Encounter (Signed)
Please contact patient and let her know that her thyroid function testing is still abnormal.  I reviewed her results with our endocrinologist. Is she taking biotin? (a vitamin supplement) If so.  I would like her to stop biotin use and I would also like to set her up for formal consultation with endocrinology.

## 2018-06-15 NOTE — Telephone Encounter (Signed)
-----   Message from Reather LittlerAjay Kumar, MD sent at 06/15/2018  8:20 AM EDT ----- Tara KaufmannMelissa This may be from taking biotin which affects T4 and T3 but she still may be hypothyroid Would be glad to see her  ----- Message ----- From: Sandford Craze'Sullivan, Gearold Wainer, NP Sent: 06/14/2018   1:36 PM To: Reather LittlerAjay Kumar, MD  Hi Dr. Lucianne MussKumar,  Would you mind glancing at this pt's TFT's please?  Not sure what to make of them. Please let me know if she needs a formal consult and I am happy to arrange.  Thanks,  [employer] MillsMelissa

## 2018-06-16 NOTE — Telephone Encounter (Signed)
Let's repeat tsh, free t3 and free t4 in 1 month. If still abnormal will replaced endocrinology order at that time.

## 2018-06-16 NOTE — Telephone Encounter (Signed)
Author phoned pt. Re: thyroid function tests. Pt. states she does take biotin, and she will stop per [name]'s instructions. Author made pt. Aware of endocrinology referral, but pt. states she would rather just have her T3/4 levels rechecked after stopping biotin, since she pays out of pocket for her doctor visits. [facility].

## 2018-06-16 NOTE — Telephone Encounter (Signed)
Patient agreed will call back to schedule lab appointment. Orders placed.

## 2018-06-17 ENCOUNTER — Encounter: Payer: Self-pay | Admitting: Family

## 2018-06-17 DIAGNOSIS — R946 Abnormal results of thyroid function studies: Secondary | ICD-10-CM

## 2018-07-10 ENCOUNTER — Encounter: Payer: Self-pay | Admitting: Family

## 2018-07-19 ENCOUNTER — Other Ambulatory Visit (INDEPENDENT_AMBULATORY_CARE_PROVIDER_SITE_OTHER): Payer: BLUE CROSS/BLUE SHIELD

## 2018-07-19 DIAGNOSIS — R946 Abnormal results of thyroid function studies: Secondary | ICD-10-CM

## 2018-07-19 LAB — T4, FREE: Free T4: 0.74 ng/dL (ref 0.60–1.60)

## 2018-07-19 LAB — T3, FREE: T3 FREE: 3.2 pg/mL (ref 2.3–4.2)

## 2018-07-19 LAB — TSH: TSH: 8.17 u[IU]/mL — AB (ref 0.35–4.50)

## 2018-07-20 ENCOUNTER — Telehealth: Payer: Self-pay | Admitting: Family

## 2018-07-20 MED ORDER — LEVOTHYROXINE SODIUM 50 MCG PO TABS: 50.0000 ug | ORAL_TABLET | Freq: Every day | ORAL | 1 refills | Status: DC

## 2018-07-20 NOTE — Telephone Encounter (Signed)
See mychart.  

## 2018-07-25 NOTE — Telephone Encounter (Signed)
Results released

## 2018-08-16 ENCOUNTER — Ambulatory Visit (INDEPENDENT_AMBULATORY_CARE_PROVIDER_SITE_OTHER): Payer: BLUE CROSS/BLUE SHIELD | Admitting: Endocrinology

## 2018-08-16 ENCOUNTER — Encounter: Payer: Self-pay | Admitting: Endocrinology

## 2018-08-16 VITALS — BP 126/88 | HR 67 | Ht 61.5 in | Wt 158.0 lb

## 2018-08-16 DIAGNOSIS — E039 Hypothyroidism, unspecified: Secondary | ICD-10-CM

## 2018-08-16 NOTE — Progress Notes (Addendum)
Patient ID: Tara Hamilton, female   DOB: 12/07/1960, 57 y.o.   MRN: [id number]           Referring Provider: Sandford Craze  Reason for Appointment:  Hypothyroidism, new visit    History of Present Illness:   Hypothyroidism was first diagnosed in 04/2018  At the time of diagnosis patient was having symptoms of   exhaustion, some sleepiness, fatigue, cold extremities, difficulty concentrating, dry skin and a little hoarseness  Initially her TSH was only 5.4 and she had a repeat test done in July The patient was not started on treatment until a confirmation test was done again in 9/19 after stopping biotin Free T4 was back to normal in 9/19, previously high at 1.9     The patient has been treated with  levothyroxine 50 mcg daily since 9/19  With starting thyroid supplementation the patient's symptoms are improving and she is having less fatigue although still not back to normal She is also having better ability to concentrate and has not had as much cold extremities  The patient takes the thyroid supplement at least 30 minutes before breakfast         Patient's weight history is as follows:  Wt Readings from Last 3 Encounters:  08/16/18 158 lb (71.7 kg)  05/03/18 158 lb 3.2 oz (71.8 kg)  03/09/18 158 lb 0.6 oz (71.7 kg)    Thyroid function results have been as follows:  Lab Results  Component Value Date   TSH 8.17 (H) 07/19/2018   TSH 7.84 (H) 06/14/2018   TSH 5.38 (H) 05/03/2018   TSH 3.85 03/10/2017   FREET4 0.74 07/19/2018   FREET4 1.90 (H) 06/14/2018   FREET4 1.1 05/09/2018   FREET4 2.49 (H) 03/11/2016   T3FREE 3.2 07/19/2018   T3FREE 8.0 (H) 06/14/2018   T3FREE 3.1 05/09/2018     Past Medical History:  Diagnosis Date  . Allergy   . Hypertension     Past Surgical History:  Procedure Laterality Date  . ABDOMINAL HYSTERECTOMY  2000  . TONSILLECTOMY      Family History  Problem Relation Age of Onset  . Hypertension Mother   . Stroke Mother 62  .  Hypertension Father   . Cancer Maternal Aunt 70       uterine  . Heart disease Paternal Grandmother     Social History:  reports that she has never smoked. She has never used smokeless tobacco. She reports that she drinks about 1.0 standard drinks of alcohol per week. She reports that she does not use drugs.  Allergies:  Allergies  Allergen Reactions  . Penicillins Swelling    Allergies as of 08/16/2018      Reactions   Penicillins Swelling      Medication List        Accurate as of 08/16/18  4:21 PM. Always use your most recent med list.          estradiol 0.5 MG tablet Commonly known as:  ESTRACE Take 1 tablet (0.5 mg total) by mouth daily.   fexofenadine 180 MG tablet Commonly known as:  ALLEGRA Take 180 mg by mouth daily.   hydrochlorothiazide 12.5 MG capsule Commonly known as:  MICROZIDE Take 1 capsule (12.5 mg total) by mouth daily.   levothyroxine 50 MCG tablet Commonly known as:  SYNTHROID, LEVOTHROID Take 1 tablet (50 mcg total) by mouth daily.   MULTIVITAMIN ADULT PO Take 1 tablet by mouth daily.   TGT CALCIUM DIETARY SUPPLEMENT PO  Take 1 tablet by mouth daily.   vitamin C 1000 MG tablet Take 1,000 mg by mouth daily.          Review of Systems  Constitutional: Negative for weight loss.  HENT:       Voice is a little raspy  Respiratory: Positive for daytime sleepiness.   Cardiovascular: Negative for leg swelling.  Gastrointestinal: Positive for constipation.  Endocrine: Positive for fatigue.       Has occasional hot flashes, has been on HRT since her complete hysterectomy in the year 2000  Musculoskeletal: Negative for joint pain.  Skin: Positive for dry skin.  Neurological: Negative for numbness and tingling.  Psychiatric/Behavioral: Negative for insomnia.                Examination:    BP 126/88   Pulse 67   Ht 5' 1.5" (1.562 m)   Wt 158 lb (71.7 kg)   SpO2 99%   BMI 29.37 kg/m   GENERAL:  Average build.   No pallor.     Skin:  no rash or significant skin lesions.  EYES:  No prominence of the eyes or swelling  ENT: Oral mucosa and tongue normal.  NECK: No lymphadenopathy  THYROID:  Not palpable.  HEART:  Normal  S1 and S2; no murmur or click.  CHEST:    Lungs: Vescicular breath sounds heard equally.  No crepitations/ wheeze.  ABDOMEN:  No distention.  Liver and spleen not palpable.  No other mass or tenderness.  NEUROLOGICAL: Reflexes are 1+ bilaterally at biceps with normal relaxation, not able to elicit reflexes at ankles  EXTREMITIES:  Normal peripheral joints.  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 Discussed that since her baseline T3 level has not been relatively low most likely she should be treated with levothyroxine alone Patient information given on hypothyroidism and discussed nature of autoimmune thyroid disease and natural history  Follow-up to be decided   Reather Littler 08/16/2018, 4:21 PM   Consultation note copy sent to the PCP  Note: This office note was prepared with Dragon voice recognition system technology. Any transcriptional errors that result from this process are unintentional.

## 2018-08-17 LAB — T3, FREE: T3 FREE: 3.1 pg/mL (ref 2.3–4.2)

## 2018-08-17 LAB — T4, FREE: Free T4: 0.96 ng/dL (ref 0.60–1.60)

## 2018-08-17 LAB — TSH: TSH: 2.43 u[IU]/mL (ref 0.35–4.50)

## 2018-08-18 NOTE — Progress Notes (Signed)
Please call to let patient know that the lab results are normal and change needed, continue follow-up with [facility]

## 2018-09-05 ENCOUNTER — Encounter: Payer: Self-pay | Admitting: Family

## 2018-09-05 MED ORDER — LEVOTHYROXINE SODIUM 50 MCG PO TABS: 50.0000 ug | ORAL_TABLET | Freq: Every day | ORAL | 1 refills | Status: DC

## 2019-01-04 ENCOUNTER — Encounter: Payer: Self-pay | Admitting: Family

## 2019-01-04 DIAGNOSIS — E2839 Other primary ovarian failure: Secondary | ICD-10-CM

## 2019-01-04 DIAGNOSIS — Z0000 Encounter for general adult medical examination without abnormal findings: Secondary | ICD-10-CM

## 2019-01-05 NOTE — Addendum Note (Signed)
Addended by: Sandford Craze on: 01/05/2019 01:59 PM   Modules accepted: Orders

## 2019-01-12 ENCOUNTER — Other Ambulatory Visit: Payer: Self-pay | Admitting: Family

## 2019-01-12 DIAGNOSIS — Z1231 Encounter for screening mammogram for malignant neoplasm of breast: Secondary | ICD-10-CM

## 2019-01-16 ENCOUNTER — Ambulatory Visit
Admission: RE | Admit: 2019-01-16 | Discharge: 2019-01-16 | Disposition: A | Discharge status: Home / Self Care | Payer: BLUE CROSS/BLUE SHIELD | Source: Ambulatory Visit

## 2019-01-16 DIAGNOSIS — Z1231 Encounter for screening mammogram for malignant neoplasm of breast: Secondary | ICD-10-CM

## 2019-02-10 ENCOUNTER — Other Ambulatory Visit: Payer: BLUE CROSS/BLUE SHIELD

## 2019-02-15 ENCOUNTER — Inpatient Hospital Stay: Admission: RE | Admit: 2019-02-15 | Payer: BLUE CROSS/BLUE SHIELD | Source: Ambulatory Visit

## 2019-02-20 ENCOUNTER — Other Ambulatory Visit: Payer: Self-pay | Admitting: Family

## 2019-02-21 ENCOUNTER — Encounter: Payer: Self-pay | Admitting: Family

## 2019-02-21 MED ORDER — LEVOTHYROXINE SODIUM 50 MCG PO TABS: 50.0000 ug | ORAL_TABLET | Freq: Every day | ORAL | 1 refills | Status: DC

## 2019-03-11 ENCOUNTER — Encounter: Payer: Self-pay | Admitting: Family

## 2019-03-13 ENCOUNTER — Telehealth (INDEPENDENT_AMBULATORY_CARE_PROVIDER_SITE_OTHER): Payer: BLUE CROSS/BLUE SHIELD | Admitting: Family

## 2019-03-13 DIAGNOSIS — R197 Diarrhea, unspecified: Secondary | ICD-10-CM | POA: Diagnosis not present

## 2019-03-13 DIAGNOSIS — R1013 Epigastric pain: Secondary | ICD-10-CM

## 2019-03-13 NOTE — Telephone Encounter (Signed)
Scheduled for today at 1:20  

## 2019-03-13 NOTE — Progress Notes (Signed)
Virtual Visit via Video Note  I connected with Tara Hamilton on 03/13/19 at  1:20 PM EDT by a video enabled telemedicine application and verified that I am speaking with the correct person using two identifiers. This visit type was conducted due to national recommendations for restrictions regarding the COVID-19 Pandemic (e.g. social distancing).  This format is felt to be most appropriate for this patient at this time.   I discussed the limitations of evaluation and management by telemedicine and the availability of in person appointments. The patient expressed understanding and agreed to proceed.  Only the patient and myself were on today's video visit. The patient was at home and I was in my office at the time of today's visit.   History of Present Illness:  Patient reports that the last Saturday of February she developed diarrhea. Reports that she has been drinking water with electrolytes. Reports that she started omeprazole on Friday and has felt much much better since then.  She reports some mild gi discomfort, right under the rib cage. "kind of in the middle."  Denies blood in the stool- it just goes right through me. No fever.    Yesterday had 3 loose stools.  Today no loose stools. Denies black or tar like stools.   Observations/Objective:   Gen: Awake, alert, no acute distress Resp: Breathing is even and non-labored Psych: calm/pleasant demeanor Neuro: Alert and Oriented x 3, + facial symmetry, speech is clear.  Assessment and Plan:  Gen: Awake, alert, no acute distress Resp: Breathing is even and non-labored Psych: calm/pleasant demeanor Neuro: Alert and Oriented x 3, + facial symmetry, speech is clear.   Follow Up Instructions:  Diarrhea- seems to be improving. Advised pt to come to the lab to pick up a stool testing kit if recurrent diarrhea.  Order placed.   Epigastric pain- improved with PPI. Advised pt to continue omeprazole. She is advised to call if recurrent  abdominal pai as we would then plan an abdominal US to evaluate for cholecystitis. Pt verbalizes understanding.    I discussed the assessment and treatment plan with the patient. The patient was provided an opportunity to ask questions and all were answered. The patient agreed with the plan and demonstrated an understanding of the instructions.   The patient was advised to call back or seek an in-person evaluation if the symptoms worsen or if the condition fails to improve as anticipated.    Nance Pear, NP

## 2019-03-17 ENCOUNTER — Other Ambulatory Visit: Payer: BLUE CROSS/BLUE SHIELD

## 2019-03-17 ENCOUNTER — Other Ambulatory Visit: Payer: Self-pay

## 2019-03-17 DIAGNOSIS — R197 Diarrhea, unspecified: Secondary | ICD-10-CM

## 2019-03-21 ENCOUNTER — Telehealth: Payer: Self-pay | Admitting: *Deleted

## 2019-03-21 ENCOUNTER — Encounter: Payer: Self-pay | Admitting: Family

## 2019-03-21 DIAGNOSIS — R197 Diarrhea, unspecified: Secondary | ICD-10-CM

## 2019-03-21 LAB — GI PROFILE, STOOL, PCR

## 2019-03-21 NOTE — Telephone Encounter (Signed)
Received Lab Report results from [facility]; forwarded to provider/SLS 05/05

## 2019-03-24 ENCOUNTER — Other Ambulatory Visit: Payer: BLUE CROSS/BLUE SHIELD

## 2019-03-27 MED ORDER — HYDROCHLOROTHIAZIDE 12.5 MG PO CAPS: 12.5000 mg | ORAL_CAPSULE | Freq: Every day | ORAL | 1 refills | Status: DC

## 2019-03-27 MED ORDER — ESTRADIOL 0.5 MG PO TABS: 0.5000 mg | ORAL_TABLET | Freq: Every day | ORAL | 1 refills | Status: DC

## 2019-03-27 NOTE — Addendum Note (Signed)
Addended by: Sandford Craze on: 03/27/2019 12:31 PM   Modules accepted: Orders

## 2019-03-27 NOTE — Addendum Note (Signed)
Addended by: Sandford Craze on: 03/27/2019 09:15 AM   Modules accepted: Orders

## 2019-03-28 ENCOUNTER — Encounter: Payer: Self-pay | Admitting: General Surgery

## 2019-03-29 ENCOUNTER — Encounter: Payer: Self-pay | Admitting: Gastroenterology

## 2019-03-29 ENCOUNTER — Ambulatory Visit (INDEPENDENT_AMBULATORY_CARE_PROVIDER_SITE_OTHER): Payer: BLUE CROSS/BLUE SHIELD | Admitting: Gastroenterology

## 2019-03-29 ENCOUNTER — Other Ambulatory Visit: Payer: Self-pay

## 2019-03-29 VITALS — Ht 61.0 in | Wt 158.0 lb

## 2019-03-29 DIAGNOSIS — K582 Mixed irritable bowel syndrome: Secondary | ICD-10-CM

## 2019-03-29 DIAGNOSIS — R110 Nausea: Secondary | ICD-10-CM | POA: Diagnosis not present

## 2019-03-29 DIAGNOSIS — R140 Abdominal distension (gaseous): Secondary | ICD-10-CM | POA: Diagnosis not present

## 2019-03-29 DIAGNOSIS — R1010 Upper abdominal pain, unspecified: Secondary | ICD-10-CM

## 2019-03-29 NOTE — Patient Instructions (Addendum)
Obtain abdominal ultrasound to exclude gallbladder disease Likely etiology irritable bowel syndrome with alternating constipation and diarrhea, [facility] will be contacting you to schedule your  appointment   Lactose-free diet  Check fecal fat qualitative and fecal elastase to exclude pancreatic insufficiency ( Please come to [facility] the Basement level to have your labs drawn) Lab hours 8-4pm   Benefiber 1 teaspoon 3 times daily with meals  Probiotic Align, 1 capsule daily for 2 to 4 weeks, over the counter   Due for recall colonoscopy in January 2024 ( we have put in a recall in our system and will mail you out a reminder when that is due)   Follow-up telemedicine visit in 2 to 3 weeks   Lactose-Free Diet, Adult If you have lactose intolerance, you are not able to digest lactose. Lactose is a natural sugar found mainly in dairy milk and dairy products. You may need to avoid all foods and beverages that contain lactose. A lactose-free diet can help you do this. Which foods have lactose? Lactose is found in dairy milk and dairy products, such as:  Yogurt.  Cheese.  Butter.  Margarine.  Sour cream.  Cream.  Whipped toppings and nondairy creamers.  Ice cream and other dairy-based desserts. Lactose is also found in foods or products made with dairy milk or milk ingredients. To find out whether a food contains dairy milk or a milk ingredient, look at the ingredients list. Avoid foods with the statement "May contain milk" and foods that contain:  Milk powder.  Whey.  Curd.  Caseinate.  Lactose.  Lactalbumin.  Lactoglobulin. What are alternatives to dairy milk and foods made with milk products?  Lactose-free milk.  Soy milk with added calcium and vitamin D.  Almond milk, coconut milk, rice milk, or other nondairy milk alternatives with added calcium and vitamin D. Note that these are low in protein.  Soy products, such as soy yogurt, soy cheese,  soy ice cream, and soy-based sour cream.  Other nut milk products, such as almond yogurt, almond cheese, cashew yogurt, cashew cheese, cashew ice cream, coconut yogurt, and coconut ice cream. What are tips for following this plan?  Do not consume foods, beverages, vitamins, minerals, or medicines containing lactose. Read ingredient lists carefully.  Look for the words "lactose-free" on labels.  Use lactase enzyme drops or tablets as directed by your health care provider.  Use lactose-free milk or a milk alternative, such as soy milk or almond milk, for drinking and cooking.  Make sure you get enough calcium and vitamin D in your diet. A lactose-free eating plan can be lacking in these important nutrients.  Take calcium and vitamin D supplements as directed by your health care provider. Talk to your health care provider about supplements if you are not able to get enough calcium and vitamin D from food. What foods can I eat?  Fruits All fresh, canned, frozen, or dried fruits that are not processed with lactose. Vegetables All fresh, frozen, and canned vegetables without cheese, cream, or butter sauces. Grains Any that are not made with dairy milk or dairy products. Meats and other proteins Any meat, fish, poultry, and other protein sources that are not made with dairy milk or dairy products. Soy cheese and yogurt. Fats and oils Any that are not made with dairy milk or dairy products. Beverages Lactose-free milk. Soy, rice, or almond milk with added calcium and vitamin D. Fruit and vegetable juices. Sweets and desserts Any that  are not made with dairy milk or dairy products. Seasonings and condiments Any that are not made with dairy milk or dairy products. Calcium Calcium is found in many foods that contain lactose and is important for bone health. The amount of calcium you need depends on your age:  Adults younger than 50 years: 1,000 mg of calcium a day.  Adults older than 50  years: 1,200 mg of calcium a day. If you are not getting enough calcium, you may get it from other sources, including:  Orange juice with calcium added. There are 300-350 mg of calcium in 1 cup of orange juice.  Calcium-fortified soy milk. There are 300-400 mg of calcium in 1 cup of calcium-fortified soy milk.  Calcium-fortified rice or almond milk. There are 300 mg of calcium in 1 cup of calcium-fortified rice or almond milk.  Calcium-fortified breakfast cereals. There are 100-1,000 mg of calcium in calcium-fortified breakfast cereals.  Spinach, cooked. There are 145 mg of calcium in  cup of cooked spinach.  Edamame, cooked. There are 130 mg of calcium in  cup of cooked edamame.  Collard greens, cooked. There are 125 mg of calcium in  cup of cooked collard greens.  Kale, frozen or cooked. There are 90 mg of calcium in  cup of cooked or frozen kale.  Almonds. There are 95 mg of calcium in  cup of almonds.  Broccoli, cooked. There are 60 mg of calcium in 1 cup of cooked broccoli. The items listed above may not be a complete list of recommended foods and beverages. Contact a dietitian for more options. What foods are not recommended? Fruits None, unless they are made with dairy milk or dairy products. Vegetables None, unless they are made with dairy milk or dairy products. Grains Any grains that are made with dairy milk or dairy products. Meats and other proteins None, unless they are made with dairy milk or dairy products. Dairy All dairy products, including milk, goat's milk, buttermilk, kefir, acidophilus milk, flavored milk, evaporated milk, condensed milk, dulce de [place], eggnog, yogurt, cheese, and cheese spreads. Fats and oils Any that are made with milk or milk products. Margarines and salad dressings that contain milk or cheese. Cream. Half and half. Cream cheese. Sour cream. Chip dips made with sour cream or yogurt. Beverages Hot chocolate. Cocoa with lactose.  Instant iced teas. Powdered fruit drinks. Smoothies made with dairy milk or yogurt. Sweets and desserts Any that are made with milk or milk products. Seasonings and condiments Chewing gum that has lactose. Spice blends if they contain lactose. Artificial sweeteners that contain lactose. Nondairy creamers. The items listed above may not be a complete list of foods and beverages to avoid. Contact a dietitian for more information. Summary  If you are lactose intolerant, it means that you have a hard time digesting lactose, a natural sugar found in milk and milk products.  Following a lactose-free diet can help you manage this condition.  Calcium is important for bone health and is found in many foods that contain lactose. Talk with your health care provider about other sources of calcium. This information is not intended to replace advice given to you by your health care provider. Make sure you discuss any questions you have with your health care provider. Document Released: 04/24/2002 Document Revised: 11/30/2017 Document Reviewed: 11/30/2017 Elsevier Interactive Patient Education  2019 [employer]

## 2019-03-29 NOTE — Progress Notes (Signed)
Tara Hamilton    [id number]    [birth year]  Primary Care Physician:O'Sullivan, Efraim KaufmannMelissa, NP  Referring Physician: Sandford Craze'Sullivan, [name], NP [address] [place], [place] [id number]  This service was provided via audio and video telemedicine (Doximity) due to COVID 19 pandemic.  Patient location: Home Provider location: Office Used 2 patient identifiers to confirm the correct person. Explained the limitations in evaluation and management via telemedicine. Patient is aware of potential medical charges for this visit.  Patient consented to this virtual visit.  The persons participating in this telemedicine service were myself and the patient   Chief complaint:  Diarrhea  HPI:  58 year old female with complaints of intermittent diarrhea for past 2 to 3 months. Diarrhea is worse whenever she eats greasy food. Has diarrhea alternating with constipation.  On some days she has 4-5 bowel movements a day followed by no bowel movement for a day or so.  Denies nocturnal diarrhea. Her symptoms started towards the end of February, at the time she was eating yogurt and realized that it has gone bad and has quit eating yogurt since then.  She also stopped taking Metamucil she was taking previously.  She has changed her diet and is avoiding milk and dairy products with no improvement. Has intermittent nausea, bloating and upper abdominal discomfort.  GI pathogen panel negative Mar 17, 2019  Colonoscopy January 2014 :diverticulosis but no polyps    Outpatient Encounter Medications as of 03/29/2019  Medication Sig  . Ascorbic Acid (VITAMIN C) 1000 MG tablet Take 1,000 mg by mouth daily.  . Calcium Carbonate-Vitamin D (TGT CALCIUM DIETARY SUPPLEMENT PO) Take 1 tablet by mouth daily.  Marland Kitchen. estradiol (ESTRACE) 0.5 MG tablet Take 1 tablet (0.5 mg total) by mouth daily.  . fexofenadine (ALLEGRA) 180 MG tablet Take 180 mg by mouth daily.  . hydrochlorothiazide (MICROZIDE) 12.5  MG capsule Take 1 capsule (12.5 mg total) by mouth daily.  Marland Kitchen. levothyroxine (SYNTHROID, LEVOTHROID) 50 MCG tablet Take 1 tablet by mouth once daily  . levothyroxine (SYNTHROID, LEVOTHROID) 50 MCG tablet Take 1 tablet (50 mcg total) by mouth daily.  . Multiple Vitamins-Minerals (MULTIVITAMIN ADULT PO) Take 1 tablet by mouth daily.   No facility-administered encounter medications on file as of 03/29/2019.     Allergies as of 03/29/2019 - Review Complete 03/29/2019  Allergen Reaction Noted  . Penicillins Swelling 03/08/2015    Past Medical History:  Diagnosis Date  . Allergy   . Hypertension     Past Surgical History:  Procedure Laterality Date  . ABDOMINAL HYSTERECTOMY  2000  . TONSILLECTOMY      Family History  Problem Relation Age of Onset  . Hypertension Mother   . Stroke Mother [id number]  . Hypertension Father   . Cancer Maternal Aunt 70       uterine  . Heart disease Paternal Grandmother   . Diabetes Sister   . Diabetes Brother   . Thyroid disease Neg Hx     Social History   Socioeconomic History  . Marital status: Divorced    Spouse name: Not on file  . Number of children: Not on file  . Years of education: Not on file  . Highest education level: Not on file  Occupational History  . Not on file  Social Needs  . Financial resource strain: Not on file  . Food insecurity:    Worry: Not on file    Inability: Not  on file  . Transportation needs:    Medical: Not on file    Non-medical: Not on file  Tobacco Use  . Smoking status: Never Smoker  . Smokeless tobacco: Never Used  Substance and Sexual Activity  . Alcohol use: Yes    Alcohol/week: 1.0 standard drinks    Types: 1 Glasses of wine per week    Comment: occasion  . Drug use: No  . Sexual activity: Not Currently  Lifestyle  . Physical activity:    Days per week: Not on file    Minutes per session: Not on file  . Stress: Not on file  Relationships  . Social connections:    Talks on phone: Not on file     Gets together: Not on file    Attends religious service: Not on file    Active member of club or organization: Not on file    Attends meetings of clubs or organizations: Not on file    Relationship status: Not on file  . Intimate partner violence:    Fear of current or ex partner: Not on file    Emotionally abused: Not on file    Physically abused: Not on file    Forced sexual activity: Not on file  Other Topics Concern  . Not on file  Social History Narrative   From here, recently moved back from 20 years in [place].    Works as a IT consultant   2 grown sons one in [place], other son lives in [place]   Single    One grandchild in [place] Radio broadcast assistant)   Enjoys Nutritional therapist, church, reading      Review of systems: Review of Systems as per HPI All other systems reviewed and are negative.   Physical Exam: Vitals were not taken and physical exam was not performed during this virtual visit.  Data Reviewed:  Reviewed labs, radiology imaging, old records and pertinent past GI work up   Assessment and Plan/Recommendations:  58 year old female with change in bowel habits, alternating constipation and diarrhea since February 2020. She has intermittent nausea, abdominal bloating and upper abdominal discomfort.  Obtain abdominal ultrasound to exclude gallbladder disease Likely etiology irritable bowel syndrome with alternating constipation and diarrhea Lactose-free diet  Check fecal fat qualitative and fecal elastase to exclude pancreatic insufficiency  Benefiber 1 teaspoon 3 times daily with meals Probiotic Align, 1 capsule daily for 2 to 4 weeks  Due for recall colonoscopy in January 2024  Follow-up telemedicine visit in 2 to 3 weeks    K. Scherry Ran , MD   CC: Sandford Craze, NP

## 2019-03-31 ENCOUNTER — Other Ambulatory Visit: Payer: BLUE CROSS/BLUE SHIELD

## 2019-04-04 ENCOUNTER — Other Ambulatory Visit: Payer: BLUE CROSS/BLUE SHIELD

## 2019-04-04 DIAGNOSIS — R1010 Upper abdominal pain, unspecified: Secondary | ICD-10-CM | POA: Diagnosis not present

## 2019-04-04 DIAGNOSIS — R110 Nausea: Secondary | ICD-10-CM | POA: Diagnosis not present

## 2019-04-04 DIAGNOSIS — R140 Abdominal distension (gaseous): Secondary | ICD-10-CM | POA: Diagnosis not present

## 2019-04-04 DIAGNOSIS — K582 Mixed irritable bowel syndrome: Secondary | ICD-10-CM | POA: Diagnosis not present

## 2019-04-11 LAB — FECAL FAT, QUALITATIVE
Fat Qual Neutral, Stl: NORMAL
Fat Qual Total, Stl: NORMAL

## 2019-04-11 LAB — PANCREATIC ELASTASE, FECAL: Pancreatic Elastase, Fecal: 294 ug Elast./g (ref >200)

## 2019-04-12 ENCOUNTER — Other Ambulatory Visit: Payer: BLUE CROSS/BLUE SHIELD

## 2019-04-13 ENCOUNTER — Encounter: Payer: Self-pay | Admitting: General Surgery

## 2019-04-14 ENCOUNTER — Other Ambulatory Visit: Payer: Self-pay

## 2019-04-14 ENCOUNTER — Ambulatory Visit (INDEPENDENT_AMBULATORY_CARE_PROVIDER_SITE_OTHER): Payer: BLUE CROSS/BLUE SHIELD | Admitting: Gastroenterology

## 2019-04-14 VITALS — Ht 60.0 in | Wt 158.0 lb

## 2019-04-14 DIAGNOSIS — K580 Irritable bowel syndrome with diarrhea: Secondary | ICD-10-CM | POA: Diagnosis not present

## 2019-04-14 DIAGNOSIS — K219 Gastro-esophageal reflux disease without esophagitis: Secondary | ICD-10-CM

## 2019-04-14 MED ORDER — OMEPRAZOLE 20 MG PO CPDR: 20.0000 mg | DELAYED_RELEASE_CAPSULE | Freq: Every day | ORAL | 3 refills | Status: DC

## 2019-04-14 NOTE — Patient Instructions (Addendum)
Continue Benefiber 1 teaspoon 3 times daily with meals  Continue probiotic align 1 capsule daily as needed for additional 2 to 4 weeks  Omeprazole 20 mg daily, 30 minutes before breakfast.  Please send prescription with refills for a year.  I have sent this to your pharmacy electronically.  Antireflux measures  Follow-up in 1 year

## 2019-04-14 NOTE — Progress Notes (Signed)
Tara Hamilton    [id number]    24-Apr-1961  Primary Care Physician:O'Sullivan, Efraim Kaufmann, NP  Referring Physician: Sandford Craze, NP [address] [place], [place] [address]  This service was provided via audio and video telemedicine (Doximity) due to COVID 19 pandemic.  Patient location: Home Provider location: Office Used 2 patient identifiers to confirm the correct person. Explained the limitations in evaluation and management via telemedicine. Patient is aware of potential medical charges for this visit.  Patient consented to this virtual visit.  The persons participating in this telemedicine service were myself and the patient   Chief complaint: IBS -diarrhea, GERD HPI:  58 year old female for follow-up visit. Overall doing much better.  Diarrhea has resolved with Benefiber and probiotic. She is having 1 bowel movement daily with no blood or mucus. She continues to have intermittent heartburn and dyspepsia symptoms, taking omeprazole over-the-counter as needed with improvement. Denies any dysphagia, odynophagia, nausea, vomiting or abdominal pain.   GI work-up:  Fecal elastase and qualitative fecal fat within normal limits  GI pathogen panel negative Mar 17, 2019  Colonoscopy January 2014 :diverticulosis but no polyps   Outpatient Encounter Medications as of 04/14/2019  Medication Sig  . Ascorbic Acid (VITAMIN C) 1000 MG tablet Take 1,000 mg by mouth daily.  . Calcium Carbonate-Vitamin D (TGT CALCIUM DIETARY SUPPLEMENT PO) Take 1 tablet by mouth daily.  Marland Kitchen estradiol (ESTRACE) 0.5 MG tablet Take 1 tablet (0.5 mg total) by mouth daily.  . fexofenadine (ALLEGRA) 180 MG tablet Take 180 mg by mouth daily.  . hydrochlorothiazide (MICROZIDE) 12.5 MG capsule Take 1 capsule (12.5 mg total) by mouth daily.  Marland Kitchen levothyroxine (SYNTHROID, LEVOTHROID) 50 MCG tablet Take 1 tablet by mouth once daily  . levothyroxine (SYNTHROID, LEVOTHROID) 50 MCG  tablet Take 1 tablet (50 mcg total) by mouth daily.  . Multiple Vitamins-Minerals (MULTIVITAMIN ADULT PO) Take 1 tablet by mouth daily.   No facility-administered encounter medications on file as of 04/14/2019.     Allergies as of 04/14/2019 - Review Complete 04/13/2019  Allergen Reaction Noted  . Penicillins Swelling 03/08/2015    Past Medical History:  Diagnosis Date  . Allergy   . Hypertension     Past Surgical History:  Procedure Laterality Date  . ABDOMINAL HYSTERECTOMY  2000  . TONSILLECTOMY      Family History  Problem Relation Age of Onset  . Hypertension Mother   . Stroke Mother 10  . Hypertension Father   . Cancer Maternal Aunt 70       uterine  . Heart disease Paternal Grandmother   . Diabetes Sister   . Diabetes Brother   . Thyroid disease Neg Hx     Social History   Socioeconomic History  . Marital status: Divorced    Spouse name: Not on file  . Number of children: Not on file  . Years of education: Not on file  . Highest education level: Not on file  Occupational History  . Not on file  Social Needs  . Financial resource strain: Not on file  . Food insecurity:    Worry: Not on file    Inability: Not on file  . Transportation needs:    Medical: Not on file    Non-medical: Not on file  Tobacco Use  . Smoking status: Never Smoker  . Smokeless tobacco: Never Used  Substance and Sexual Activity  . Alcohol use: Yes  Alcohol/week: 1.0 standard drinks    Types: 1 Glasses of wine per week    Comment: occasion  . Drug use: No  . Sexual activity: Not Currently  Lifestyle  . Physical activity:    Days per week: Not on file    Minutes per session: Not on file  . Stress: Not on file  Relationships  . Social connections:    Talks on phone: Not on file    Gets together: Not on file    Attends religious service: Not on file    Active member of club or organization: Not on file    Attends meetings of clubs or organizations: Not on file     Relationship status: Not on file  . Intimate partner violence:    Fear of current or ex partner: Not on file    Emotionally abused: Not on file    Physically abused: Not on file    Forced sexual activity: Not on file  Other Topics Concern  . Not on file  Social History Narrative   From here, recently moved back from 20 years in [place].    Works as a IT consultantparalegal   2 grown sons one in [place], other son lives in [place]   Single    One grandchild in [place] Radio broadcast assistant(grand-daughter)   Enjoys Nutritional therapistmaking jewelry, church, reading      Review of systems: Review of Systems as per HPI All other systems reviewed and are negative.   Physical Exam: Vitals were not taken and physical exam was not performed during this virtual visit.  Data Reviewed:  Reviewed labs, radiology imaging, old records and pertinent past GI work up   Assessment and Plan/Recommendations:  58 year old female with irritable bowel syndrome predominant diarrhea and GERD  Continue Benefiber 1 teaspoon 3 times daily with meals Continue probiotic align as needed for additional 2 to 4 weeks  No evidence of pancreatic insufficiency  GERD: Omeprazole 20 mg daily, 30 minutes before breakfast Antireflux measures  Due for recall colonoscopy January 2024  Follow-up in 1 year or sooner if needed    K. Scherry RanVeena Branson Kranz , MD   CC: Sandford Craze'Sullivan, [name], NP

## 2019-05-05 ENCOUNTER — Encounter: Payer: BLUE CROSS/BLUE SHIELD | Admitting: Family

## 2019-05-11 DIAGNOSIS — S61211A Laceration without foreign body of left index finger without damage to nail, initial encounter: Secondary | ICD-10-CM | POA: Diagnosis not present

## 2019-05-16 ENCOUNTER — Other Ambulatory Visit: Payer: BLUE CROSS/BLUE SHIELD

## 2019-05-23 ENCOUNTER — Other Ambulatory Visit: Payer: Self-pay | Admitting: Family

## 2019-05-23 DIAGNOSIS — Z13820 Encounter for screening for osteoporosis: Secondary | ICD-10-CM

## 2019-05-24 ENCOUNTER — Other Ambulatory Visit: Payer: BC Managed Care – PPO

## 2019-06-05 ENCOUNTER — Encounter: Payer: BC Managed Care – PPO | Admitting: Family

## 2019-06-08 ENCOUNTER — Ambulatory Visit
Admission: RE | Admit: 2019-06-08 | Discharge: 2019-06-08 | Disposition: A | Discharge status: Home / Self Care | Payer: BC Managed Care – PPO | Source: Ambulatory Visit | Attending: Family | Admitting: Family

## 2019-06-08 ENCOUNTER — Other Ambulatory Visit: Payer: Self-pay

## 2019-06-08 DIAGNOSIS — Z13820 Encounter for screening for osteoporosis: Secondary | ICD-10-CM

## 2019-06-08 DIAGNOSIS — Z0000 Encounter for general adult medical examination without abnormal findings: Secondary | ICD-10-CM | POA: Diagnosis not present

## 2019-06-12 ENCOUNTER — Encounter: Payer: Self-pay | Admitting: Family

## 2019-06-12 DIAGNOSIS — M8580 Other specified disorders of bone density and structure, unspecified site: Secondary | ICD-10-CM | POA: Insufficient documentation

## 2019-06-20 ENCOUNTER — Other Ambulatory Visit: Payer: Self-pay

## 2019-06-20 ENCOUNTER — Ambulatory Visit (INDEPENDENT_AMBULATORY_CARE_PROVIDER_SITE_OTHER): Payer: BC Managed Care – PPO | Admitting: Family

## 2019-06-20 ENCOUNTER — Encounter: Payer: Self-pay | Admitting: Family

## 2019-06-20 VITALS — BP 118/80 | HR 71 | Temp 98.4°F | Resp 16 | Ht 60.0 in | Wt 164.0 lb

## 2019-06-20 DIAGNOSIS — Z0000 Encounter for general adult medical examination without abnormal findings: Secondary | ICD-10-CM | POA: Diagnosis not present

## 2019-06-20 DIAGNOSIS — M8580 Other specified disorders of bone density and structure, unspecified site: Secondary | ICD-10-CM | POA: Diagnosis not present

## 2019-06-20 LAB — CBC WITH DIFFERENTIAL/PLATELET
Basophils Absolute: 0.1 10*3/uL (ref 0.0–0.1)
Basophils Relative: 1.1 % (ref 0.0–3.0)
Eosinophils Absolute: 0.1 10*3/uL (ref 0.0–0.7)
Eosinophils Relative: 1.7 % (ref 0.0–5.0)
HCT: 40.8 % (ref 36.0–46.0)
Hemoglobin: 13.9 g/dL (ref 12.0–15.0)
Lymphocytes Relative: 39.2 % (ref 12.0–46.0)
Lymphs Abs: 1.9 10*3/uL (ref 0.7–4.0)
MCHC: 34 g/dL (ref 30.0–36.0)
MCV: 94.3 fl (ref 78.0–100.0)
Monocytes Absolute: 0.3 10*3/uL (ref 0.1–1.0)
Monocytes Relative: 7 % (ref 3.0–12.0)
Neutro Abs: 2.4 10*3/uL (ref 1.4–7.7)
Neutrophils Relative %: 51 % (ref 43.0–77.0)
Platelets: 324 10*3/uL (ref 150.0–400.0)
RBC: 4.33 Mil/uL (ref 3.87–5.11)
RDW: 12 % (ref 11.5–15.5)
WBC: 4.8 10*3/uL (ref 4.0–10.5)

## 2019-06-20 LAB — LIPID PANEL
Cholesterol: 200 mg/dL (ref 0–200)
HDL: 53.7 mg/dL (ref >39.00)
LDL Cholesterol: 123 mg/dL — ABNORMAL HIGH (ref 0–99)
NonHDL: 146.17
Total CHOL/HDL Ratio: 4
Triglycerides: 118 mg/dL (ref 0.0–149.0)
VLDL: 23.6 mg/dL (ref 0.0–40.0)

## 2019-06-20 LAB — TSH: TSH: 4.8 u[IU]/mL — ABNORMAL HIGH (ref 0.35–4.50)

## 2019-06-20 LAB — HEPATIC FUNCTION PANEL
ALT: 13 U/L (ref 0–35)
AST: 13 U/L (ref 0–37)
Albumin: 4.3 g/dL (ref 3.5–5.2)
Alkaline Phosphatase: 69 U/L (ref 39–117)
Bilirubin, Direct: 0 mg/dL (ref 0.0–0.3)
Total Bilirubin: 0.3 mg/dL (ref 0.2–1.2)
Total Protein: 6.4 g/dL (ref 6.0–8.3)

## 2019-06-20 LAB — BASIC METABOLIC PANEL
BUN: 23 mg/dL (ref 6–23)
CO2: 31 mEq/L (ref 19–32)
Calcium: 9.8 mg/dL (ref 8.4–10.5)
Chloride: 102 mEq/L (ref 96–112)
Creatinine, Ser: 0.91 mg/dL (ref 0.40–1.20)
GFR: 63.75 mL/min (ref >60.00)
Glucose, Bld: 103 mg/dL — ABNORMAL HIGH (ref 70–99)
Potassium: 4.7 mEq/L (ref 3.5–5.1)
Sodium: 140 mEq/L (ref 135–145)

## 2019-06-20 NOTE — Patient Instructions (Signed)
Please complete laba work prior to leaving.

## 2019-06-20 NOTE — Progress Notes (Signed)
Subjective:    Patient ID: Tara Hamilton, female    DOB: 1961/04/14, 58 y.o.   MRN: [id number]  HPI  Patient presents today for complete physical.  Immunizations: tetanus and shingrix up to date Diet:  Reports diet is healthy Wt Readings from Last 3 Encounters:  06/20/19 164 lb (74.4 kg)  04/14/19 158 lb (71.7 kg)  04/13/19 158 lb (71.7 kg)  Exercise:  Some exercise (walks at lunch) Colonoscopy: 2014, due 2024 Dexa: osteopenia,  Pap Smear: hysterectomy Mammogram: 01/16/19    Review of Systems  Constitutional: Negative for unexpected weight change.  HENT: Negative for hearing loss and rhinorrhea.   Eyes: Negative for visual disturbance.  Respiratory: Negative for cough.   Cardiovascular: Negative for leg swelling.  Gastrointestinal: Negative for constipation, diarrhea and nausea.  Genitourinary: Negative for dysuria, frequency and hematuria.  Musculoskeletal: Negative for arthralgias and myalgias.  Skin: Negative for rash.  Neurological: Negative for headaches.  Hematological: Negative for adenopathy.  Psychiatric/Behavioral:       Denies depression/anxiety   Past Medical History:  Diagnosis Date  . Allergy   . Hypertension   . Osteopenia      Social History   Socioeconomic History  . Marital status: Divorced    Spouse name: Not on file  . Number of children: Not on file  . Years of education: Not on file  . Highest education level: Not on file  Occupational History  . Not on file  Social Needs  . Financial resource strain: Not on file  . Food insecurity    Worry: Not on file    Inability: Not on file  . Transportation needs    Medical: Not on file    Non-medical: Not on file  Tobacco Use  . Smoking status: Never Smoker  . Smokeless tobacco: Never Used  Substance and Sexual Activity  . Alcohol use: Yes    Alcohol/week: 1.0 standard drinks    Types: 1 Glasses of wine per week    Comment: occasion  . Drug use: No  . Sexual activity: Not Currently   Lifestyle  . Physical activity    Days per week: Not on file    Minutes per session: Not on file  . Stress: Not on file  Relationships  . Social Herbalist on phone: Not on file    Gets together: Not on file    Attends religious service: Not on file    Active member of club or organization: Not on file    Attends meetings of clubs or organizations: Not on file    Relationship status: Not on file  . Intimate partner violence    Fear of current or ex partner: Not on file    Emotionally abused: Not on file    Physically abused: Not on file    Forced sexual activity: Not on file  Other Topics Concern  . Not on file  Social History Narrative   From here, recently moved back from 20 years in [place].    Works as a Radio broadcast assistant   2 grown sons one in [place], other son lives in [place]    One grandchild in [place] Theatre manager)   Enjoys Social worker, church, reading    Past Surgical History:  Procedure Laterality Date  . ABDOMINAL HYSTERECTOMY  2000  . TONSILLECTOMY      Family History  Problem Relation Age of Onset  . Hypertension Mother   . Stroke Mother  74  . Hypertension Father   . Cancer Maternal Aunt 70       uterine  . Heart disease Paternal Grandmother   . Diabetes Sister   . Diabetes Brother   . Thyroid disease Neg Hx     Allergies  Allergen Reactions  . Penicillins Swelling    Current Outpatient Medications on File Prior to Visit  Medication Sig Dispense Refill  . Ascorbic Acid (VITAMIN C) 1000 MG tablet Take 1,000 mg by mouth daily.    . Calcium Carbonate-Vitamin D (TGT CALCIUM DIETARY SUPPLEMENT PO) Take 1 tablet by mouth daily.    Marland Kitchen. estradiol (ESTRACE) 0.5 MG tablet Take 1 tablet (0.5 mg total) by mouth daily. 90 tablet 1  . fexofenadine (ALLEGRA) 180 MG tablet Take 180 mg by mouth daily.    . hydrochlorothiazide (MICROZIDE) 12.5 MG capsule Take 1 capsule (12.5 mg total) by mouth daily. 90 capsule 1  . levothyroxine  (SYNTHROID, LEVOTHROID) 50 MCG tablet Take 1 tablet by mouth once daily 90 tablet 0  . Multiple Vitamins-Minerals (MULTIVITAMIN ADULT PO) Take 1 tablet by mouth daily.    Marland Kitchen. omeprazole (PRILOSEC) 20 MG capsule Take 1 capsule (20 mg total) by mouth daily. 90 capsule 3   No current facility-administered medications on file prior to visit.     BP 118/80 (BP Location: Right Arm, Patient Position: Sitting, Cuff Size: Normal)   Pulse 71   Temp 98.4 F (36.9 C) (Oral)   Resp 16   Ht 5' (1.524 m)   Wt 164 lb (74.4 kg)   SpO2 99%   BMI 32.03 kg/m       Objective:   Physical Exam  Physical Exam  Constitutional: She is oriented to person, place, and time. She appears well-developed and well-nourished. No distress.  HENT:  Head: Normocephalic and atraumatic.  Right Ear: Tympanic membrane and ear canal normal.  Left Ear: Tympanic membrane and ear canal normal.  Mouth/Throat: Oropharynx is clear and moist.  Eyes: Pupils are equal, round, and reactive to light. No scleral icterus.  Neck: Normal range of motion. No thyromegaly present.  Cardiovascular: Normal rate and regular rhythm.   No murmur heard. Pulmonary/Chest: Effort normal and breath sounds normal. No respiratory distress. He has no wheezes. She has no rales. She exhibits no tenderness.  Abdominal: Soft. Bowel sounds are normal. She exhibits no distension and no mass. There is no tenderness. There is no rebound and no guarding.  Musculoskeletal: She exhibits no edema.  Lymphadenopathy:    She has no cervical adenopathy.  Neurological: She is alert and oriented to person, place, and time. She has normal patellar reflexes. She exhibits normal muscle tone. Coordination normal.  Skin: Skin is warm and dry.  Psychiatric: She has a normal mood and affect. Her behavior is normal. Judgment and thought content normal.  Breasts: Examined lying          Assessment & Plan:   Preventative care- Discussed healthy diet, exercise and  exercise. Pap and mammo up to date, dexa up to date.  Obtain routine lab work.       Assessment & Plan:

## 2019-06-24 LAB — VITAMIN D 1,25 DIHYDROXY
Vitamin D 1, 25 (OH)2 Total: 44 pg/mL (ref 18–72)
Vitamin D2 1, 25 (OH)2: <8 pg/mL
Vitamin D3 1, 25 (OH)2: 44 pg/mL

## 2019-06-25 ENCOUNTER — Telehealth: Payer: Self-pay | Admitting: Family

## 2019-06-25 MED ORDER — LEVOTHYROXINE SODIUM 75 MCG PO TABS: 75.0000 ug | ORAL_TABLET | Freq: Every day | ORAL | 0 refills | Status: DC

## 2019-06-25 NOTE — Telephone Encounter (Signed)
Lab work shows synthroid needs increase.  Will increase synthroid from 50 mcg to 75 mcg.  Repeat TSH in 6 weeks.

## 2019-06-26 NOTE — Telephone Encounter (Signed)
No answer no vm

## 2019-06-29 ENCOUNTER — Other Ambulatory Visit: Payer: Self-pay

## 2019-06-29 DIAGNOSIS — E039 Hypothyroidism, unspecified: Secondary | ICD-10-CM

## 2019-06-29 NOTE — Telephone Encounter (Signed)
Patient advised of results and medication increased. Was scheduled to come in for labs 08-14-19

## 2019-06-29 NOTE — Telephone Encounter (Signed)
Please send patient a mychart if you are unable to contact her.

## 2019-07-04 ENCOUNTER — Encounter: Payer: Self-pay | Admitting: Family

## 2019-07-04 NOTE — Telephone Encounter (Signed)
Anderson Malta, are you able to help with this please to recode the dexa Z0.00 please?

## 2019-07-07 NOTE — Telephone Encounter (Signed)
Tara Hamilton, could you please call the [facility] and ask them to resubmit as Z0.00?

## 2019-07-17 ENCOUNTER — Encounter: Payer: Self-pay | Admitting: Family

## 2019-07-18 NOTE — Telephone Encounter (Signed)
Anderson Malta, please see mychart message. Do you mind following back up on this and updating patient please?

## 2019-08-14 ENCOUNTER — Other Ambulatory Visit (INDEPENDENT_AMBULATORY_CARE_PROVIDER_SITE_OTHER): Payer: BC Managed Care – PPO

## 2019-08-14 ENCOUNTER — Other Ambulatory Visit: Payer: Self-pay

## 2019-08-14 DIAGNOSIS — E039 Hypothyroidism, unspecified: Secondary | ICD-10-CM | POA: Diagnosis not present

## 2019-08-14 LAB — TSH: TSH: 2.64 u[IU]/mL (ref 0.35–4.50)

## 2019-09-12 ENCOUNTER — Other Ambulatory Visit: Payer: Self-pay | Admitting: Family

## 2019-09-12 ENCOUNTER — Encounter: Payer: Self-pay | Admitting: Family

## 2019-09-29 ENCOUNTER — Other Ambulatory Visit: Payer: Self-pay | Admitting: Family

## 2019-12-15 ENCOUNTER — Encounter: Payer: Self-pay | Admitting: Family

## 2019-12-15 ENCOUNTER — Other Ambulatory Visit: Payer: Self-pay | Admitting: Family

## 2019-12-15 MED ORDER — LEVOTHYROXINE SODIUM 75 MCG PO TABS: 75.0000 ug | ORAL_TABLET | Freq: Every day | ORAL | 2 refills | Status: DC

## 2019-12-29 DIAGNOSIS — Z03818 Encounter for observation for suspected exposure to other biological agents ruled out: Secondary | ICD-10-CM | POA: Diagnosis not present

## 2019-12-29 DIAGNOSIS — Z20828 Contact with and (suspected) exposure to other viral communicable diseases: Secondary | ICD-10-CM | POA: Diagnosis not present

## 2020-01-16 ENCOUNTER — Other Ambulatory Visit: Payer: Self-pay | Admitting: Family

## 2020-01-18 ENCOUNTER — Other Ambulatory Visit: Payer: Self-pay | Admitting: Family

## 2020-01-18 ENCOUNTER — Encounter: Payer: Self-pay | Admitting: Family

## 2020-02-27 ENCOUNTER — Other Ambulatory Visit: Payer: Self-pay | Admitting: Family

## 2020-02-27 DIAGNOSIS — Z1231 Encounter for screening mammogram for malignant neoplasm of breast: Secondary | ICD-10-CM

## 2020-03-05 ENCOUNTER — Other Ambulatory Visit: Payer: Self-pay

## 2020-03-05 ENCOUNTER — Ambulatory Visit
Admission: RE | Admit: 2020-03-05 | Discharge: 2020-03-05 | Disposition: A | Discharge status: Home / Self Care | Payer: BC Managed Care – PPO | Source: Ambulatory Visit | Attending: Family | Admitting: Family

## 2020-03-05 DIAGNOSIS — Z1231 Encounter for screening mammogram for malignant neoplasm of breast: Secondary | ICD-10-CM

## 2020-03-15 ENCOUNTER — Other Ambulatory Visit: Payer: Self-pay | Admitting: Family

## 2020-04-01 ENCOUNTER — Other Ambulatory Visit: Payer: Self-pay | Admitting: Family

## 2020-04-19 ENCOUNTER — Encounter: Payer: Self-pay | Admitting: Family

## 2020-04-22 MED ORDER — SYNTHROID 75 MCG PO TABS: 75.0000 ug | ORAL_TABLET | Freq: Every day | ORAL | 5 refills | Status: DC

## 2020-05-06 DIAGNOSIS — L821 Other seborrheic keratosis: Secondary | ICD-10-CM | POA: Diagnosis not present

## 2020-05-06 DIAGNOSIS — Z1283 Encounter for screening for malignant neoplasm of skin: Secondary | ICD-10-CM | POA: Diagnosis not present

## 2020-05-14 ENCOUNTER — Encounter: Payer: Self-pay | Admitting: Family

## 2020-05-14 ENCOUNTER — Other Ambulatory Visit: Payer: Self-pay | Admitting: Gastroenterology

## 2020-07-04 ENCOUNTER — Other Ambulatory Visit: Payer: Self-pay | Admitting: Family

## 2020-09-18 ENCOUNTER — Encounter: Payer: Self-pay | Admitting: Family

## 2020-09-18 MED ORDER — SYNTHROID 75 MCG PO TABS: 75.0000 ug | ORAL_TABLET | Freq: Every day | ORAL | 0 refills | Status: DC

## 2020-09-18 MED ORDER — ESTRADIOL 0.5 MG PO TABS: 0.5000 mg | ORAL_TABLET | Freq: Every day | ORAL | 0 refills | Status: DC

## 2020-09-18 MED ORDER — HYDROCHLOROTHIAZIDE 12.5 MG PO CAPS: 12.5000 mg | ORAL_CAPSULE | Freq: Every day | ORAL | 0 refills | Status: DC

## 2020-10-08 ENCOUNTER — Other Ambulatory Visit: Payer: Self-pay

## 2020-10-08 MED ORDER — SYNTHROID 75 MCG PO TABS: 75.0000 ug | ORAL_TABLET | Freq: Every day | ORAL | 0 refills | Status: DC

## 2020-10-17 ENCOUNTER — Telehealth: Payer: Self-pay | Admitting: Family

## 2020-10-17 ENCOUNTER — Encounter: Payer: Self-pay | Admitting: Family

## 2020-10-17 DIAGNOSIS — Z20822 Contact with and (suspected) exposure to covid-19: Secondary | ICD-10-CM | POA: Diagnosis not present

## 2020-10-17 DIAGNOSIS — U071 COVID-19: Secondary | ICD-10-CM | POA: Diagnosis not present

## 2020-10-17 NOTE — Telephone Encounter (Signed)
Patient scheduled for virtual visit tomorrow am.  Started sneezing Sat 11-27 symptoms continue to progress after that

## 2020-10-17 NOTE — Telephone Encounter (Signed)
Patient states dx with covid 19 and states she would like infusion referral. Patient also cancelled her appointment for a physical on 10/22/2020 and will need all prescriptions refilled please.

## 2020-10-17 NOTE — Telephone Encounter (Signed)
Let's schedule her for a virtual visit tomorrow AM please and I can assess her and place referral for infusion. What day did her symptoms start?

## 2020-10-18 ENCOUNTER — Telehealth: Payer: Self-pay | Admitting: Nurse Practitioner

## 2020-10-18 ENCOUNTER — Telehealth (INDEPENDENT_AMBULATORY_CARE_PROVIDER_SITE_OTHER): Payer: BC Managed Care – PPO | Admitting: Family

## 2020-10-18 ENCOUNTER — Other Ambulatory Visit: Payer: Self-pay

## 2020-10-18 DIAGNOSIS — U071 COVID-19: Secondary | ICD-10-CM

## 2020-10-18 MED ORDER — HYDROCHLOROTHIAZIDE 12.5 MG PO CAPS: 12.5000 mg | ORAL_CAPSULE | Freq: Every day | ORAL | 0 refills | Status: DC

## 2020-10-18 MED ORDER — SYNTHROID 75 MCG PO TABS: 75.0000 ug | ORAL_TABLET | Freq: Every day | ORAL | 0 refills | Status: DC

## 2020-10-18 MED ORDER — OMEPRAZOLE 20 MG PO CPDR: 20.0000 mg | DELAYED_RELEASE_CAPSULE | Freq: Every day | ORAL | 0 refills | Status: DC

## 2020-10-18 NOTE — Progress Notes (Signed)
Virtual Visit via Video Note  I connected with Tara Hamilton on 10/18/20 at  7:40 AM EST by a video enabled telemedicine application and verified that I am speaking with the correct person using two identifiers.  Location: Patient: home Provider: work   I discussed the limitations of evaluation and management by telemedicine and the availability of in person appointments. The patient expressed understanding and agreed to proceed. Only the patient and myself were present for today's video call.   History of Present Illness:  Patient is a 59 yr old female who presents today to discuss COVID-19 infection. She states that she began sneezing on Saturday 11/27.  She then developed nasal congestion on Sunday 11/28.  12/2 she lost taste and smell.  The patient tested positive for COVID-19 on 10/16/20.  She denies associated fever. She has been monitoring her oxygen closely with a pulse oximeter and states that her oxygen has been stable in the mid 90's.    Diagnosed with COVID on 12/1.  She reports sneezing on Saturday.  Then she developed nasal congestion. Yesterday she lost taste and smell.  She denies fever. She has been monitoring her oxygen and has been in the mid 90's.  She had  covid vaccines on 7/16 and 8/7.      Observations/Objective:   Gen: Awake, alert, no acute distress Resp: Breathing is even and non-labored Psych: calm/pleasant demeanor Neuro: Alert and Oriented x 3, + facial symmetry, speech is clear.   Assessment and Plan:  COVID-19 infection.  Pt appears clinically stable at this time. She would like to receive the mononclonal antibody infusion and I have placed a referral for her.  Advised of CDC guidelines for self isolation/ ending isolation.  Advised of safe practice guidelines. Symptom Tier reviewed.  Encouraged to monitor for any worsening symptoms; watch for increased shortness of breath, weakness, and signs of dehydration. Advised when to seek emergency care.   Instructed to rest and hydrate well.  Advised to leave the house during recommended isolation period, only if it is necessary to seek medical care.  20 minutes spent on today's visit.  The majority of this time was spent counseling the patient on covid-19 care.    Follow Up Instructions:    I discussed the assessment and treatment plan with the patient. The patient was provided an opportunity to ask questions and all were answered. The patient agreed with the plan and demonstrated an understanding of the instructions.   The patient was advised to call back or seek an in-person evaluation if the symptoms worsen or if the condition fails to improve as anticipated.  Lemont Fillers, NP

## 2020-10-18 NOTE — Telephone Encounter (Signed)
Called to discuss with Tara Hamilton about Covid symptoms and the use of a monoclonal antibody infusion for those with mild to moderate Covid symptoms and at a high risk of hospitalization.     Pt is qualified for this infusion at the [facility] due to co-morbid conditions and/or a member of an at-risk group, however declines infusion at this time. She is concerned of cost. Symptoms tier reviewed as well as criteria for ending isolation.  Symptoms reviewed that would warrant ED/Hospital evaluation. Preventative practices reviewed. Patient verbalized understanding. Patient advised to call back if he/she opts to proceed with infusion. Callback number provided. Urgent care and/or ER precautions given for severe symptoms. Last date eligible for infusion: Monday.     Patient Active Problem List   Diagnosis Date Noted  . Osteopenia   . Urinary incontinence 09/09/2016  . Allergic rhinitis 09/09/2016  . HTN (hypertension) 08/20/2015  . Menopausal syndrome 08/20/2015  . Asthma 08/20/2015  . Preventative health care 03/11/2015     Consuello Masse, NP [phone] [name]

## 2020-10-22 ENCOUNTER — Encounter: Payer: BC Managed Care – PPO | Admitting: Family

## 2020-11-22 ENCOUNTER — Encounter: Payer: Self-pay | Admitting: Family

## 2020-11-22 MED ORDER — ESTRADIOL 0.5 MG PO TABS: 0.5000 mg | ORAL_TABLET | Freq: Every day | ORAL | 0 refills | Status: DC

## 2020-12-13 ENCOUNTER — Encounter: Payer: Self-pay | Admitting: Family

## 2020-12-13 ENCOUNTER — Other Ambulatory Visit: Payer: Self-pay | Admitting: Family

## 2020-12-13 ENCOUNTER — Ambulatory Visit (INDEPENDENT_AMBULATORY_CARE_PROVIDER_SITE_OTHER): Payer: BC Managed Care – PPO | Admitting: Family

## 2020-12-13 ENCOUNTER — Other Ambulatory Visit: Payer: Self-pay

## 2020-12-13 VITALS — BP 121/86 | HR 75 | Temp 98.5°F | Resp 16 | Ht 61.5 in | Wt 166.8 lb

## 2020-12-13 DIAGNOSIS — E039 Hypothyroidism, unspecified: Secondary | ICD-10-CM

## 2020-12-13 DIAGNOSIS — Z0000 Encounter for general adult medical examination without abnormal findings: Secondary | ICD-10-CM

## 2020-12-13 DIAGNOSIS — E785 Hyperlipidemia, unspecified: Secondary | ICD-10-CM

## 2020-12-13 DIAGNOSIS — Z23 Encounter for immunization: Secondary | ICD-10-CM | POA: Diagnosis not present

## 2020-12-13 LAB — COMPREHENSIVE METABOLIC PANEL
ALT: 13 U/L (ref 0–35)
AST: 14 U/L (ref 0–37)
Albumin: 4.5 g/dL (ref 3.5–5.2)
Alkaline Phosphatase: 66 U/L (ref 39–117)
BUN: 20 mg/dL (ref 6–23)
CO2: 32 mEq/L (ref 19–32)
Calcium: 9.7 mg/dL (ref 8.4–10.5)
Chloride: 103 mEq/L (ref 96–112)
Creatinine, Ser: 0.83 mg/dL (ref 0.40–1.20)
GFR: 77.76 mL/min (ref >60.00)
Glucose, Bld: 98 mg/dL (ref 70–99)
Potassium: 4.3 mEq/L (ref 3.5–5.1)
Sodium: 141 mEq/L (ref 135–145)
Total Bilirubin: 0.5 mg/dL (ref 0.2–1.2)
Total Protein: 6.5 g/dL (ref 6.0–8.3)

## 2020-12-13 LAB — LIPID PANEL
Cholesterol: 190 mg/dL (ref 0–200)
HDL: 57.6 mg/dL (ref >39.00)
LDL Cholesterol: 114 mg/dL — ABNORMAL HIGH (ref 0–99)
NonHDL: 132.4
Total CHOL/HDL Ratio: 3
Triglycerides: 92 mg/dL (ref 0.0–149.0)
VLDL: 18.4 mg/dL (ref 0.0–40.0)

## 2020-12-13 LAB — TSH: TSH: 2.16 u[IU]/mL (ref 0.35–4.50)

## 2020-12-13 MED ORDER — LEVOTHYROXINE SODIUM 75 MCG PO TABS: 75.0000 ug | ORAL_TABLET | Freq: Every day | ORAL | 1 refills | Status: DC

## 2020-12-13 NOTE — Progress Notes (Signed)
Subjective:    Patient ID: Tara Hamilton, female    DOB: 28-Dec-1960, 60 y.o.   MRN: [id number]  HPI  Patient presents today for complete physical.  Immunizations: pfizer x 2, shingrix complete, due for pneumovax booster Diet:  Working hard on diet. Exercise:  Not exercising Wt Readings from Last 3 Encounters:  12/13/20 166 lb 12.8 oz (75.7 kg)  06/20/19 164 lb (74.4 kg)  04/14/19 158 lb (71.7 kg)  Colonoscopy: about 8 years ago Dexa: 2020 Pap Smear: hysterectomy Mammogram: 03/05/20 Vision:  10/21 Dental: 2 weeks ago    Review of Systems  Constitutional: Negative for unexpected weight change.  HENT: Negative for hearing loss and rhinorrhea.   Eyes: Negative for visual disturbance.  Respiratory: Negative for cough and shortness of breath.   Cardiovascular: Negative for chest pain and leg swelling.  Gastrointestinal: Positive for constipation (occasional). Negative for diarrhea.  Genitourinary: Negative for dysuria and frequency.       C/o stress incontinence  Musculoskeletal: Negative for arthralgias and myalgias.  Skin: Negative for rash.  Neurological: Negative for headaches.  Hematological: Negative for adenopathy.  Psychiatric/Behavioral:       Denies depression/anxiety   Past Medical History:  Diagnosis Date  . Allergy   . Hypertension   . Osteopenia      Social History   Socioeconomic History  . Marital status: Divorced    Spouse name: Not on file  . Number of children: Not on file  . Years of education: Not on file  . Highest education level: Not on file  Occupational History  . Not on file  Tobacco Use  . Smoking status: Never Smoker  . Smokeless tobacco: Never Used  Vaping Use  . Vaping Use: Never used  Substance and Sexual Activity  . Alcohol use: Yes    Alcohol/week: 1.0 standard drink    Types: 1 Glasses of wine per week    Comment: occasion  . Drug use: No  . Sexual activity: Not Currently  Other Topics Concern  . Not on file   Social History Narrative   From here, recently moved back from 20 years in [place].    Works as a IT consultant   2 grown sons one in [place], other son lives in [place]   Single    One grandchild in [place] Radio broadcast assistant)   Enjoys Nutritional therapist, church, reading   Social Determinants of Corporate investment banker Strain: Not on [employer] Insecurity: Not on file  Transportation Needs: Not on file  Physical Activity: Not on file  Stress: Not on file  Social Connections: Not on file  Intimate Partner Violence: Not on file    Past Surgical History:  Procedure Laterality Date  . ABDOMINAL HYSTERECTOMY  2000  . TONSILLECTOMY      Family History  Problem Relation Age of Onset  . Hypertension Mother   . Stroke Mother 44  . Hypertension Father   . Cancer Maternal Aunt 70       uterine  . Heart disease Paternal Grandmother   . Diabetes Sister   . Diabetes Brother   . Thyroid disease Neg Hx     Allergies  Allergen Reactions  . Penicillins Swelling    Current Outpatient Medications on File Prior to Visit  Medication Sig Dispense Refill  . Ascorbic Acid (VITAMIN C) 1000 MG tablet Take 1,000 mg by mouth daily.    . Calcium Carbonate-Vitamin D (TGT CALCIUM DIETARY SUPPLEMENT PO) Take 1  tablet by mouth daily.    Marland Kitchen estradiol (ESTRACE) 0.5 MG tablet Take 1 tablet (0.5 mg total) by mouth daily. 90 tablet 0  . fexofenadine (ALLEGRA) 180 MG tablet Take 180 mg by mouth daily.    . hydrochlorothiazide (MICROZIDE) 12.5 MG capsule Take 1 capsule (12.5 mg total) by mouth daily. 90 capsule 0  . Multiple Vitamins-Minerals (MULTIVITAMIN ADULT PO) Take 1 tablet by mouth daily.    Marland Kitchen SYNTHROID 75 MCG tablet Take 1 tablet (75 mcg total) by mouth daily before breakfast. 90 tablet 0   No current facility-administered medications on file prior to visit.    BP 121/86 (BP Location: Right Arm, Patient Position: Sitting, Cuff Size: Small)   Pulse 75   Temp 98.5 F (36.9 C) (Oral)   Resp 16    Ht 5' 1.5" (1.562 m)   Wt 166 lb 12.8 oz (75.7 kg)   SpO2 100%   BMI 31.01 kg/m        Objective:   Physical Exam  Physical Exam  Constitutional: She is oriented to person, place, and time. She appears well-developed and well-nourished. No distress.  HENT:  Head: Normocephalic and atraumatic.  Right Ear: Tympanic membrane and ear canal normal.  Left Ear: Tympanic membrane and ear canal normal.  Mouth/Throat: not examined- pt wearing mask Eyes: Pupils are equal, round, and reactive to light. No scleral icterus.  Neck: Normal range of motion. No thyromegaly present.  Cardiovascular: Normal rate and regular rhythm.   No murmur heard. Pulmonary/Chest: Effort normal and breath sounds normal. No respiratory distress. He has no wheezes. She has no rales. She exhibits no tenderness.  Abdominal: Soft. Bowel sounds are normal. She exhibits no distension and no mass. There is no tenderness. There is no rebound and no guarding.  Musculoskeletal: She exhibits no edema.  Lymphadenopathy:    She has no cervical adenopathy.  Neurological: She is alert and oriented to person, place, and time. She has normal patellar reflexes. She exhibits normal muscle tone. Coordination normal.  Skin: Skin is warm and dry.  Psychiatric: She has a normal mood and affect. Her behavior is normal. Judgment and thought content normal.  Breasts: Examined lying Right: Without masses, retractions, discharge or axillary adenopathy.  Left: Without masses, retractions, discharge or axillary adenopathy.  Pelvic: deferred      Assessment & Plan:  Preventative care-encouraged pt to exercise 30 minutes, 5 days a week. Obtain labs as ordered.  Recommended trial of OTC Revive Bladder Support to see if this helps with incontinence. If not, plan referral to Urogyn.  Mammo up to date. Colo up to date as well per pt. States she had colo at age 63 but does not remember the name of the facility in [place]. States it was normal  without polyps. Plan to repeat at age 9.  Recommended covid booster at the 6 month mark. Pneumovax 23 today.   Hypothyroid- clinically stable on synthroid.    This visit occurred during the SARS-CoV-2 public health emergency.  Safety protocols were in place, including screening questions prior to the visit, additional usage of staff PPE, and extensive cleaning of exam room while observing appropriate contact time as indicated for disinfecting solutions.          Assessment & Plan:

## 2020-12-13 NOTE — Addendum Note (Signed)
Addended by: Wilford Corner on: 12/13/2020 09:14 AM   Modules accepted: Orders

## 2020-12-13 NOTE — Patient Instructions (Signed)
Please complete lab work prior to leaving. Please get your COVID booster after 12/23/20.     Preventive Care 6-59 Years Old, Female Preventive care refers to lifestyle choices and visits with your health care provider that can promote health and wellness. This includes:  A yearly physical exam. This is also called an annual wellness visit.  Regular dental and eye exams.  Immunizations.  Screening for certain conditions.  Healthy lifestyle choices, such as: ? Eating a healthy diet. ? Getting regular exercise. ? Not using drugs or products that contain nicotine and tobacco. ? Limiting alcohol use. What can I expect for my preventive care visit? Physical exam Your health care provider will check your:  Height and weight. These may be used to calculate your BMI (body mass index). BMI is a measurement that tells if you are at a healthy weight.  Heart rate and blood pressure.  Body temperature.  Skin for abnormal spots. Counseling Your health care provider may ask you questions about your:  Past medical problems.  Family's medical history.  Alcohol, tobacco, and drug use.  Emotional well-being.  Home life and relationship well-being.  Sexual activity.  Diet, exercise, and sleep habits.  Work and work Statistician.  Access to firearms.  Method of birth control.  Menstrual cycle.  Pregnancy history. What immunizations do I need? Vaccines are usually given at various ages, according to a schedule. Your health care provider will recommend vaccines for you based on your age, medical history, and lifestyle or other factors, such as travel or where you work.   What tests do I need? Blood tests  Lipid and cholesterol levels. These may be checked every 5 years, or more often if you are over 108 years old.  Hepatitis C test.  Hepatitis B test. Screening  Lung cancer screening. You may have this screening every year starting at age 37 if you have a 30-pack-year  history of smoking and currently smoke or have quit within the past 15 years.  Colorectal cancer screening. ? All adults should have this screening starting at age 76 and continuing until age 15. ? Your health care provider may recommend screening at age 76 if you are at increased risk. ? You will have tests every 1-10 years, depending on your results and the type of screening test.  Diabetes screening. ? This is done by checking your blood sugar (glucose) after you have not eaten for a while (fasting). ? You may have this done every 1-3 years.  Mammogram. ? This may be done every 1-2 years. ? Talk with your health care provider about when you should start having regular mammograms. This may depend on whether you have a family history of breast cancer.  BRCA-related cancer screening. This may be done if you have a family history of breast, ovarian, tubal, or peritoneal cancers.  Pelvic exam and Pap test. ? This may be done every 3 years starting at age 38. ? Starting at age 37, this may be done every 5 years if you have a Pap test in combination with an HPV test. Other tests  STD (sexually transmitted disease) testing, if you are at risk.  Bone density scan. This is done to screen for osteoporosis. You may have this scan if you are at high risk for osteoporosis. Talk with your health care provider about your test results, treatment options, and if necessary, the need for more tests. Follow these instructions at home: Eating and drinking  Eat a diet that includes  fresh fruits and vegetables, whole grains, lean protein, and low-fat dairy products.  Take vitamin and mineral supplements as recommended by your health care provider.  Do not drink alcohol if: ? Your health care provider tells you not to drink. ? You are pregnant, may be pregnant, or are planning to become pregnant.  If you drink alcohol: ? Limit how much you have to 0-1 drink a day. ? Be aware of how much alcohol is  in your drink. In the [place] one drink equals one 12 oz bottle of beer (355 mL), one 5 oz glass of wine (148 mL), or one 1 oz glass of hard liquor (44 mL).   Lifestyle  Take daily care of your teeth and gums. Brush your teeth every morning and night with fluoride toothpaste. Floss one time each day.  Stay active. Exercise for at least 30 minutes 5 or more days each week.  Do not use any products that contain nicotine or tobacco, such as cigarettes, e-cigarettes, and chewing tobacco. If you need help quitting, ask your health care provider.  Do not use drugs.  If you are sexually active, practice safe sex. Use a condom or other form of protection to prevent STIs (sexually transmitted infections).  If you do not wish to become pregnant, use a form of birth control. If you plan to become pregnant, see your health care provider for a prepregnancy visit.  If told by your health care provider, take low-dose aspirin daily starting at age 20.  Find healthy ways to cope with stress, such as: ? Meditation, yoga, or listening to music. ? Journaling. ? Talking to a trusted person. ? Spending time with friends and family. Safety  Always wear your seat belt while driving or riding in a vehicle.  Do not drive: ? If you have been drinking alcohol. Do not ride with someone who has been drinking. ? When you are tired or distracted. ? While texting.  Wear a helmet and other protective equipment during sports activities.  If you have firearms in your house, make sure you follow all gun safety procedures. What's next?  Visit your health care provider once a year for an annual wellness visit.  Ask your health care provider how often you should have your eyes and teeth checked.  Stay up to date on all vaccines. This information is not intended to replace advice given to you by your health care provider. Make sure you discuss any questions you have with your health care provider. Document Revised:  08/06/2020 Document Reviewed: 07/14/2018 Elsevier Patient Education  2021 [employer].

## 2021-01-03 ENCOUNTER — Encounter: Payer: Self-pay | Admitting: Family

## 2021-01-03 ENCOUNTER — Other Ambulatory Visit: Payer: Self-pay | Admitting: Family

## 2021-02-10 ENCOUNTER — Other Ambulatory Visit: Payer: Self-pay | Admitting: Family

## 2021-03-28 ENCOUNTER — Other Ambulatory Visit: Payer: Self-pay | Admitting: Family

## 2021-03-28 DIAGNOSIS — Z1231 Encounter for screening mammogram for malignant neoplasm of breast: Secondary | ICD-10-CM

## 2021-04-02 ENCOUNTER — Ambulatory Visit: Payer: BC Managed Care – PPO

## 2021-04-18 ENCOUNTER — Ambulatory Visit
Admission: RE | Admit: 2021-04-18 | Discharge: 2021-04-18 | Disposition: A | Discharge status: Home / Self Care | Payer: BC Managed Care – PPO | Source: Ambulatory Visit | Attending: Family | Admitting: Family

## 2021-04-18 ENCOUNTER — Other Ambulatory Visit: Payer: Self-pay

## 2021-04-18 DIAGNOSIS — Z1231 Encounter for screening mammogram for malignant neoplasm of breast: Secondary | ICD-10-CM | POA: Diagnosis not present

## 2021-04-30 ENCOUNTER — Other Ambulatory Visit: Payer: Self-pay | Admitting: Family

## 2021-05-16 ENCOUNTER — Other Ambulatory Visit: Payer: Self-pay | Admitting: Family

## 2021-05-16 MED ORDER — CIPROFLOXACIN HCL 250 MG PO TABS: 250.0000 mg | ORAL_TABLET | Freq: Two times a day (BID) | ORAL | 0 refills | Status: AC

## 2021-05-16 NOTE — Telephone Encounter (Signed)
Patient Comment: I don't need a refill for this but I didn't know how to get a message to [facility]! I am asking for a prescription of Cipro ASAP. My 59 yo mother lives with me and has had an intestinal bug. She has had diarrhea and is now getting better after 2 days on Cipro. I am having loose stools and am going out of the country early Sunday morning. I would like to prevent anything from happening. I am happy to have an evisit as I am working today. Please do notask me to go to an urgent care.    Can you please refuse refill.  Patient does not need rx.

## 2021-06-02 ENCOUNTER — Ambulatory Visit: Payer: BC Managed Care – PPO

## 2021-06-07 ENCOUNTER — Other Ambulatory Visit: Payer: Self-pay | Admitting: Family

## 2021-06-30 ENCOUNTER — Telehealth: Payer: BC Managed Care – PPO | Admitting: Nurse Practitioner

## 2021-06-30 DIAGNOSIS — N3000 Acute cystitis without hematuria: Secondary | ICD-10-CM

## 2021-06-30 MED ORDER — NITROFURANTOIN MONOHYD MACRO 100 MG PO CAPS: 100.0000 mg | ORAL_CAPSULE | Freq: Two times a day (BID) | ORAL | 0 refills | Status: AC

## 2021-06-30 NOTE — Progress Notes (Signed)
E-Visit for Urinary Problems ° °We are sorry that you are not feeling well.  Here is how we plan to help! ° °Based on what you shared with me it looks like you most likely have a simple urinary tract infection. ° °A UTI (Urinary Tract Infection) is a bacterial infection of the bladder. ° °Most cases of urinary tract infections are simple to treat but a key part of your care is to encourage you to drink plenty of fluids and watch your symptoms carefully. ° °I have prescribed MacroBid 100 mg twice a day for 5 days.  Your symptoms should gradually improve. Call us if the burning in your urine worsens, you develop worsening fever, back pain or pelvic pain or if your symptoms do not resolve after completing the antibiotic. ° °Urinary tract infections can be prevented by drinking plenty of water to keep your body hydrated.  Also be sure when you wipe, wipe from front to back and don't hold it in!  If possible, empty your bladder every 4 hours. ° °HOME CARE °Drink plenty of fluids °Compete the full course of the antibiotics even if the symptoms resolve °Remember, when you need to go…go. Holding in your urine can increase the likelihood of getting a UTI! °GET HELP RIGHT AWAY IF: °You cannot urinate °You get a high fever °Worsening back pain occurs °You see blood in your urine °You feel sick to your stomach or throw up °You feel like you are going to pass out ° °MAKE SURE YOU  °Understand these instructions. °Will watch your condition. °Will get help right away if you are not doing well or get worse. ° ° °Thank you for choosing an e-visit. ° °Your e-visit answers were reviewed by a board certified advanced clinical practitioner to complete your personal care plan. Depending upon the condition, your plan could have included both over the counter or prescription medications. ° °Please review your pharmacy choice. Make sure the pharmacy is open so you can pick up prescription now. If there is a problem, you may contact your  provider through MyChart messaging and have the prescription routed to another pharmacy.  Your safety is important to us. If you have drug allergies check your prescription carefully.  ° °For the next 24 hours you can use MyChart to ask questions about today's visit, request a non-urgent call back, or ask for a work or school excuse. °You will get an email in the next two days asking about your experience. I hope that your e-visit has been valuable and will speed your recovery.  ° °I spent approximately 7 minutes reviewing the patient's history, current symptoms and coordinating their plan of care today.   ° °Meds ordered this encounter  °Medications  ° nitrofurantoin, macrocrystal-monohydrate, (MACROBID) 100 MG capsule  °  Sig: Take 1 capsule (100 mg total) by mouth 2 (two) times daily for 5 days.  °  Dispense:  10 capsule  °  Refill:  0  °  °

## 2021-07-03 ENCOUNTER — Other Ambulatory Visit: Payer: Self-pay | Admitting: Family

## 2021-07-14 ENCOUNTER — Other Ambulatory Visit: Payer: Self-pay | Admitting: Family

## 2021-07-16 ENCOUNTER — Encounter: Payer: Self-pay | Admitting: Family

## 2021-07-16 ENCOUNTER — Other Ambulatory Visit: Payer: Self-pay | Admitting: Family

## 2021-07-16 DIAGNOSIS — I10 Essential (primary) hypertension: Secondary | ICD-10-CM

## 2021-07-18 MED ORDER — ESTRADIOL 0.5 MG PO TABS: 0.5000 mg | ORAL_TABLET | Freq: Every day | ORAL | 0 refills | Status: DC

## 2021-07-18 MED ORDER — LEVOTHYROXINE SODIUM 75 MCG PO TABS: 75.0000 ug | ORAL_TABLET | Freq: Every day | ORAL | 0 refills | Status: DC

## 2021-07-24 ENCOUNTER — Telehealth: Payer: Self-pay

## 2021-07-24 DIAGNOSIS — N390 Urinary tract infection, site not specified: Secondary | ICD-10-CM

## 2021-07-24 NOTE — Telephone Encounter (Signed)
Order signed per provider request.

## 2021-07-24 NOTE — Telephone Encounter (Signed)
Pt has made a lab visit for 07/30/21 at 8:15 and she wanted to see if it was okay to add urine culture to check if he UTI she had a couple weeks ago was still present.   Please assist. Orders pended.

## 2021-07-30 ENCOUNTER — Other Ambulatory Visit (INDEPENDENT_AMBULATORY_CARE_PROVIDER_SITE_OTHER): Payer: BC Managed Care – PPO

## 2021-07-30 ENCOUNTER — Other Ambulatory Visit: Payer: Self-pay

## 2021-07-30 DIAGNOSIS — N390 Urinary tract infection, site not specified: Secondary | ICD-10-CM | POA: Diagnosis not present

## 2021-07-30 DIAGNOSIS — I10 Essential (primary) hypertension: Secondary | ICD-10-CM

## 2021-07-30 LAB — BASIC METABOLIC PANEL
BUN: 25 mg/dL — ABNORMAL HIGH (ref 6–23)
CO2: 29 mEq/L (ref 19–32)
Calcium: 9.5 mg/dL (ref 8.4–10.5)
Chloride: 98 mEq/L (ref 96–112)
Creatinine, Ser: 0.96 mg/dL (ref 0.40–1.20)
GFR: 65.01 mL/min (ref >60.00)
Glucose, Bld: 87 mg/dL (ref 70–99)
Potassium: 4 mEq/L (ref 3.5–5.1)
Sodium: 135 mEq/L (ref 135–145)

## 2021-07-31 LAB — URINE CULTURE
MICRO NUMBER:: 12372806
Result:: NO GROWTH
SPECIMEN QUALITY:: ADEQUATE

## 2021-08-01 ENCOUNTER — Telehealth: Payer: Self-pay

## 2021-08-01 ENCOUNTER — Encounter: Payer: Self-pay | Admitting: Family

## 2021-08-01 MED ORDER — HYDROCHLOROTHIAZIDE 12.5 MG PO CAPS: 12.5000 mg | ORAL_CAPSULE | Freq: Every day | ORAL | 0 refills | Status: DC

## 2021-08-01 NOTE — Telephone Encounter (Signed)
Per patient her and the provider had agreed (see previous message) that "she can wait a year to be seen which will be January. She will like to come in for TSH only and get enough HCTZ to last until then.  Patient was advised per provider we needed to check her Blood pressure but she is very hesitant to come in now and back in January.   Please advise.

## 2021-08-01 NOTE — Telephone Encounter (Signed)
Per patient "her and provider had agree

## 2021-08-04 NOTE — Telephone Encounter (Signed)
Patient advised to check her bp for a few day and call back with that report. We will get her in for TSH sometime next week. She agrees.

## 2021-08-05 ENCOUNTER — Ambulatory Visit: Payer: BC Managed Care – PPO | Admitting: Family

## 2021-08-05 NOTE — Telephone Encounter (Signed)
Patient made appointment for 08-11-21

## 2021-08-08 ENCOUNTER — Other Ambulatory Visit: Payer: Self-pay

## 2021-08-08 DIAGNOSIS — E039 Hypothyroidism, unspecified: Secondary | ICD-10-CM

## 2021-08-08 MED ORDER — HYDROCHLOROTHIAZIDE 12.5 MG PO CAPS: 12.5000 mg | ORAL_CAPSULE | Freq: Every day | ORAL | 1 refills | Status: DC

## 2021-08-08 MED ORDER — ESTRADIOL 0.5 MG PO TABS: 0.5000 mg | ORAL_TABLET | Freq: Every day | ORAL | 1 refills | Status: DC

## 2021-08-08 MED ORDER — LEVOTHYROXINE SODIUM 75 MCG PO TABS: 75.0000 ug | ORAL_TABLET | Freq: Every day | ORAL | 1 refills | Status: DC

## 2021-08-08 NOTE — Telephone Encounter (Signed)
Noted  

## 2021-08-08 NOTE — Telephone Encounter (Signed)
BP readings Day 1-3:126/88, 129/88, 136/90 Night: 115/72    Patient wanting to know if we need the appt Monday or not????  Patient also needing thyroid labs Please advise

## 2021-08-11 ENCOUNTER — Ambulatory Visit: Payer: BC Managed Care – PPO | Admitting: Family

## 2021-08-11 NOTE — Telephone Encounter (Signed)
No notes needed

## 2021-08-12 ENCOUNTER — Other Ambulatory Visit: Payer: Self-pay

## 2021-08-12 ENCOUNTER — Other Ambulatory Visit (INDEPENDENT_AMBULATORY_CARE_PROVIDER_SITE_OTHER): Payer: BC Managed Care – PPO

## 2021-08-12 DIAGNOSIS — E039 Hypothyroidism, unspecified: Secondary | ICD-10-CM | POA: Diagnosis not present

## 2021-08-12 LAB — TSH: TSH: 2.56 u[IU]/mL (ref 0.35–5.50)

## 2021-12-15 ENCOUNTER — Encounter: Payer: Self-pay | Admitting: Family

## 2021-12-15 ENCOUNTER — Ambulatory Visit (INDEPENDENT_AMBULATORY_CARE_PROVIDER_SITE_OTHER): Payer: BC Managed Care – PPO | Admitting: Family

## 2021-12-15 VITALS — BP 126/77 | HR 77 | Temp 98.0°F | Resp 16 | Ht 61.5 in | Wt 159.0 lb

## 2021-12-15 DIAGNOSIS — E785 Hyperlipidemia, unspecified: Secondary | ICD-10-CM

## 2021-12-15 DIAGNOSIS — Z0000 Encounter for general adult medical examination without abnormal findings: Secondary | ICD-10-CM

## 2021-12-15 DIAGNOSIS — Z114 Encounter for screening for human immunodeficiency virus [HIV]: Secondary | ICD-10-CM

## 2021-12-15 DIAGNOSIS — E039 Hypothyroidism, unspecified: Secondary | ICD-10-CM

## 2021-12-15 DIAGNOSIS — Z1159 Encounter for screening for other viral diseases: Secondary | ICD-10-CM | POA: Diagnosis not present

## 2021-12-15 DIAGNOSIS — I10 Essential (primary) hypertension: Secondary | ICD-10-CM | POA: Diagnosis not present

## 2021-12-15 LAB — COMPREHENSIVE METABOLIC PANEL
ALT: 13 U/L (ref 0–35)
AST: 14 U/L (ref 0–37)
Albumin: 4.7 g/dL (ref 3.5–5.2)
Alkaline Phosphatase: 71 U/L (ref 39–117)
BUN: 19 mg/dL (ref 6–23)
CO2: 31 mEq/L (ref 19–32)
Calcium: 10.1 mg/dL (ref 8.4–10.5)
Chloride: 101 mEq/L (ref 96–112)
Creatinine, Ser: 0.88 mg/dL (ref 0.40–1.20)
GFR: 71.98 mL/min (ref >60.00)
Glucose, Bld: 90 mg/dL (ref 70–99)
Potassium: 4.2 mEq/L (ref 3.5–5.1)
Sodium: 140 mEq/L (ref 135–145)
Total Bilirubin: 0.5 mg/dL (ref 0.2–1.2)
Total Protein: 6.9 g/dL (ref 6.0–8.3)

## 2021-12-15 LAB — LIPID PANEL
Cholesterol: 212 mg/dL — ABNORMAL HIGH (ref 0–200)
HDL: 63.7 mg/dL (ref >39.00)
LDL Cholesterol: 128 mg/dL — ABNORMAL HIGH (ref 0–99)
NonHDL: 148.4
Total CHOL/HDL Ratio: 3
Triglycerides: 100 mg/dL (ref 0.0–149.0)
VLDL: 20 mg/dL (ref 0.0–40.0)

## 2021-12-15 LAB — TSH: TSH: 1.24 u[IU]/mL (ref 0.35–5.50)

## 2021-12-15 NOTE — Progress Notes (Signed)
Subjective:     Patient ID: Tara Hamilton, female    DOB: 05/28/1961, 61 y.o.   MRN: [id number]  Chief Complaint  Patient presents with   Annual Exam         HPI Patient is in today for cpx.  Immunizations:Pfizer x 2, tdap 2016 Diet: Working on [employer].  Eating less processed.  She is caring for her mother.  Started eating more sugar.  Wt Readings from Last 3 Encounters:  12/15/21 159 lb (72.1 kg)  12/13/20 166 lb 12.8 oz (75.7 kg)  06/20/19 164 lb (74.4 kg)  Exercise:  very little.  Colonoscopy: 2014 Dexa: 05/2019- osteopenia  Pap Smear: N/A hysterectomy  Mammogram: 04/18/2021 Vision/dental: dental up to date, needs to update eye appointment.    Health Maintenance Due  Topic Date Due   Hepatitis C Screening  Never done    Past Medical History:  Diagnosis Date   Allergy    Hypertension    Osteopenia     Past Surgical History:  Procedure Laterality Date   ABDOMINAL HYSTERECTOMY  2000   TONSILLECTOMY      Family History  Problem Relation Age of Onset   Hypertension Mother    Stroke Mother 54   Peripheral vascular disease Mother    Hypertension Father        died from suicide   Cancer Maternal Aunt 12       uterine   Heart disease Paternal Grandmother    Diabetes Sister    Diabetes Brother    Thyroid disease Neg Hx     Social History   Socioeconomic History   Marital status: Divorced    Spouse name: Not on file   Number of children: Not on file   Years of education: Not on file   Highest education level: Not on file  Occupational History   Not on file  Tobacco Use   Smoking status: Never   Smokeless tobacco: Never  Vaping Use   Vaping Use: Never used  Substance and Sexual Activity   Alcohol use: Yes    Alcohol/week: 1.0 standard drink    Types: 1 Glasses of wine per week    Comment: occasion   Drug use: No   Sexual activity: Not Currently  Other Topics Concern   Not on file  Social History Narrative   From here, recently moved  back from 20 years in [place].    Works as a Radio broadcast assistant   2 grown sons one in [place], other son lives in [place]    One grandchild in [place] Theatre manager)   Enjoys Social worker, church, reading   Social Determinants of Radio broadcast assistant Strain: Not on file  Food Insecurity: Not on file  Transportation Needs: Not on file  Physical Activity: Not on file  Stress: Not on file  Social Connections: Not on file  Intimate Partner Violence: Not on file    Outpatient Medications Prior to Visit  Medication Sig Dispense Refill   Ascorbic Acid (VITAMIN C) 1000 MG tablet Take 1,000 mg by mouth daily.     Calcium Carbonate-Vitamin D (TGT CALCIUM DIETARY SUPPLEMENT PO) Take 1 tablet by mouth daily.     estradiol (ESTRACE) 0.5 MG tablet Take 1 tablet (0.5 mg total) by mouth daily. NEED OFFICE VISIT BEFORE ANY FURTHER REFILLS 90 tablet 1   fexofenadine (ALLEGRA) 180 MG tablet Take 180 mg by mouth daily.     hydrochlorothiazide (MICROZIDE) 12.5  MG capsule Take 1 capsule (12.5 mg total) by mouth daily. 90 capsule 1   levothyroxine (SYNTHROID) 75 MCG tablet Take 1 tablet (75 mcg total) by mouth daily. NEED OFFICE VISIT BEFORE ANY FURTHER REFILLS 90 tablet 1   Multiple Vitamins-Minerals (MULTIVITAMIN ADULT PO) Take 1 tablet by mouth daily.     No facility-administered medications prior to visit.    Allergies  Allergen Reactions   Penicillins Swelling    Review of Systems  Constitutional:  Positive for weight loss.  HENT:  Negative for congestion.   Respiratory:  Negative for cough.   Cardiovascular:  Negative for leg swelling.  Gastrointestinal:  Positive for constipation (occasional, manages with increased water). Negative for diarrhea.  Genitourinary:  Negative for dysuria and frequency.  Psychiatric/Behavioral:         Denies depression or anxiety symptom      Objective:    Physical Exam  BP 126/77 (BP Location: Right Arm, Patient Position: Sitting, Cuff  Size: Small)    Pulse 77    Temp 98 F (36.7 C) (Oral)    Resp 16    Ht 5' 1.5" (1.562 m)    Wt 159 lb (72.1 kg)    SpO2 100%    BMI 29.56 kg/m  Wt Readings from Last 3 Encounters:  12/15/21 159 lb (72.1 kg)  12/13/20 166 lb 12.8 oz (75.7 kg)  06/20/19 164 lb (74.4 kg)      Physical Exam  Constitutional: She is oriented to person, place, and time. She appears well-developed and well-nourished. No distress.  HENT:  Head: Normocephalic and atraumatic.  Right Ear: Tympanic membrane and ear canal normal.  Left Ear: Tympanic membrane and ear canal normal.  Mouth/Throat: Not examined- pt wearing mask Eyes: Pupils are equal, round, and reactive to light. No scleral icterus.  Neck: Normal range of motion. No thyromegaly present.  Cardiovascular: Normal rate and regular rhythm.   No murmur heard. Pulmonary/Chest: Effort normal and breath sounds normal. No respiratory distress. He has no wheezes. She has no rales. She exhibits no tenderness.  Abdominal: Soft. Bowel sounds are normal. She exhibits no distension and no mass. There is no tenderness. There is no rebound and no guarding.  Musculoskeletal: She exhibits no edema.  Lymphadenopathy:    She has no cervical adenopathy.  Neurological: She is alert and oriented to person, place, and time. She has normal patellar reflexes. She exhibits normal muscle tone. Coordination normal.  Skin: Skin is warm and dry.  Psychiatric: She has a normal mood and affect. Her behavior is normal. Judgment and thought content normal.  Breasts: Examined lying Right: Without masses, retractions, discharge or axillary adenopathy.  Left: Without masses, retractions, discharge or axillary adenopathy.  Pelvic: deferred           Assessment & Plan:    Assessment & Plan:   Problem List Items Addressed This Visit       Unprioritized   Preventative health care - Primary    Discussed healthy diet, exercise, weight loss.  Declines bone density today. Declines  covid booster.       HTN (hypertension)   Relevant Orders   Comp Met (CMET)   Other Visit Diagnoses     Encounter for screening for HIV       Relevant Orders   HIV antibody (with reflex)   Need for hepatitis C screening test       Relevant Orders   Hepatitis C Antibody   Hyperlipidemia, unspecified hyperlipidemia type  Relevant Orders   Lipid panel   Hypothyroidism, unspecified type       Relevant Orders   TSH       I am having Tara Hamilton "Tara Hamilton" maintain her Multiple Vitamins-Minerals (MULTIVITAMIN ADULT PO), Calcium Carbonate-Vitamin D (TGT CALCIUM DIETARY SUPPLEMENT PO), fexofenadine, vitamin C, levothyroxine, hydrochlorothiazide, and estradiol.  No orders of the defined types were placed in this encounter.

## 2021-12-15 NOTE — Patient Instructions (Signed)
Please complete lab work prior to leaving. Continue your work on healthy diet and regular exercise. Let me know if you would like to schedule a follow up Bone density test. Please schedule a routine eye exam.

## 2021-12-15 NOTE — Assessment & Plan Note (Signed)
Discussed healthy diet, exercise, weight loss.  Declines bone density today. Declines covid booster.

## 2021-12-16 LAB — HIV ANTIBODY (ROUTINE TESTING W REFLEX): HIV 1&2 Ab, 4th Generation: NONREACTIVE

## 2021-12-16 LAB — HEPATITIS C ANTIBODY
Hepatitis C Ab: NONREACTIVE
SIGNAL TO CUT-OFF: 0.04 (ref <1.00)

## 2022-01-08 ENCOUNTER — Encounter: Payer: Self-pay | Admitting: Family

## 2022-01-08 MED ORDER — LEVOTHYROXINE SODIUM 75 MCG PO TABS: 75.0000 ug | ORAL_TABLET | Freq: Every day | ORAL | 1 refills | Status: DC

## 2022-01-10 ENCOUNTER — Other Ambulatory Visit: Payer: Self-pay | Admitting: Family

## 2022-03-17 ENCOUNTER — Other Ambulatory Visit: Payer: Self-pay | Admitting: Family

## 2022-03-17 DIAGNOSIS — Z1231 Encounter for screening mammogram for malignant neoplasm of breast: Secondary | ICD-10-CM

## 2022-03-26 ENCOUNTER — Encounter: Payer: Self-pay | Admitting: Family

## 2022-03-26 MED ORDER — HYDROCHLOROTHIAZIDE 12.5 MG PO CAPS: 12.5000 mg | ORAL_CAPSULE | Freq: Every day | ORAL | 2 refills | Status: DC

## 2022-04-13 ENCOUNTER — Other Ambulatory Visit: Payer: Self-pay | Admitting: Family

## 2022-04-20 ENCOUNTER — Ambulatory Visit
Admission: RE | Admit: 2022-04-20 | Discharge: 2022-04-20 | Disposition: A | Discharge status: Home / Self Care | Payer: BC Managed Care – PPO | Source: Ambulatory Visit | Attending: Family | Admitting: Family

## 2022-04-20 DIAGNOSIS — Z1231 Encounter for screening mammogram for malignant neoplasm of breast: Secondary | ICD-10-CM

## 2022-04-29 ENCOUNTER — Telehealth: Payer: BC Managed Care – PPO | Admitting: Physician Assistant

## 2022-04-29 DIAGNOSIS — J329 Chronic sinusitis, unspecified: Secondary | ICD-10-CM

## 2022-04-29 DIAGNOSIS — B9689 Other specified bacterial agents as the cause of diseases classified elsewhere: Secondary | ICD-10-CM

## 2022-04-29 MED ORDER — DOXYCYCLINE HYCLATE 100 MG PO TABS: 100.0000 mg | ORAL_TABLET | Freq: Two times a day (BID) | ORAL | 0 refills | Status: DC

## 2022-04-29 NOTE — Progress Notes (Signed)

## 2022-07-07 ENCOUNTER — Encounter: Payer: Self-pay | Admitting: Family

## 2022-07-07 DIAGNOSIS — Z1211 Encounter for screening for malignant neoplasm of colon: Secondary | ICD-10-CM

## 2022-07-08 NOTE — Addendum Note (Signed)
Addended by: Sandford Craze on: 07/08/2022 01:42 PM   Modules accepted: Orders

## 2022-07-09 ENCOUNTER — Encounter: Payer: Self-pay | Admitting: Gastroenterology

## 2022-08-05 ENCOUNTER — Other Ambulatory Visit: Payer: Self-pay | Admitting: Family

## 2022-08-18 ENCOUNTER — Encounter: Payer: Self-pay | Admitting: Family

## 2022-08-18 MED ORDER — ESTRADIOL 0.5 MG PO TABS: 1.0000 mg | ORAL_TABLET | Freq: Every day | ORAL | 1 refills | Status: DC

## 2022-08-19 DIAGNOSIS — M5413 Radiculopathy, cervicothoracic region: Secondary | ICD-10-CM | POA: Diagnosis not present

## 2022-08-19 DIAGNOSIS — M542 Cervicalgia: Secondary | ICD-10-CM | POA: Diagnosis not present

## 2022-08-19 DIAGNOSIS — M9901 Segmental and somatic dysfunction of cervical region: Secondary | ICD-10-CM | POA: Diagnosis not present

## 2022-08-19 DIAGNOSIS — M9903 Segmental and somatic dysfunction of lumbar region: Secondary | ICD-10-CM | POA: Diagnosis not present

## 2022-08-20 DIAGNOSIS — M542 Cervicalgia: Secondary | ICD-10-CM | POA: Diagnosis not present

## 2022-08-20 DIAGNOSIS — M9903 Segmental and somatic dysfunction of lumbar region: Secondary | ICD-10-CM | POA: Diagnosis not present

## 2022-08-20 DIAGNOSIS — M5413 Radiculopathy, cervicothoracic region: Secondary | ICD-10-CM | POA: Diagnosis not present

## 2022-08-20 DIAGNOSIS — M9901 Segmental and somatic dysfunction of cervical region: Secondary | ICD-10-CM | POA: Diagnosis not present

## 2022-08-24 DIAGNOSIS — M9903 Segmental and somatic dysfunction of lumbar region: Secondary | ICD-10-CM | POA: Diagnosis not present

## 2022-08-24 DIAGNOSIS — M542 Cervicalgia: Secondary | ICD-10-CM | POA: Diagnosis not present

## 2022-08-24 DIAGNOSIS — M5413 Radiculopathy, cervicothoracic region: Secondary | ICD-10-CM | POA: Diagnosis not present

## 2022-08-24 DIAGNOSIS — D225 Melanocytic nevi of trunk: Secondary | ICD-10-CM | POA: Diagnosis not present

## 2022-08-24 DIAGNOSIS — M9901 Segmental and somatic dysfunction of cervical region: Secondary | ICD-10-CM | POA: Diagnosis not present

## 2022-08-24 DIAGNOSIS — Z1283 Encounter for screening for malignant neoplasm of skin: Secondary | ICD-10-CM | POA: Diagnosis not present

## 2022-08-26 DIAGNOSIS — M542 Cervicalgia: Secondary | ICD-10-CM | POA: Diagnosis not present

## 2022-08-26 DIAGNOSIS — M9903 Segmental and somatic dysfunction of lumbar region: Secondary | ICD-10-CM | POA: Diagnosis not present

## 2022-08-26 DIAGNOSIS — M5413 Radiculopathy, cervicothoracic region: Secondary | ICD-10-CM | POA: Diagnosis not present

## 2022-08-26 DIAGNOSIS — M9901 Segmental and somatic dysfunction of cervical region: Secondary | ICD-10-CM | POA: Diagnosis not present

## 2022-09-03 ENCOUNTER — Ambulatory Visit: Payer: BC Managed Care – PPO | Admitting: Gastroenterology

## 2022-09-07 DIAGNOSIS — M542 Cervicalgia: Secondary | ICD-10-CM | POA: Diagnosis not present

## 2022-09-07 DIAGNOSIS — M5413 Radiculopathy, cervicothoracic region: Secondary | ICD-10-CM | POA: Diagnosis not present

## 2022-09-07 DIAGNOSIS — M9903 Segmental and somatic dysfunction of lumbar region: Secondary | ICD-10-CM | POA: Diagnosis not present

## 2022-09-07 DIAGNOSIS — M9901 Segmental and somatic dysfunction of cervical region: Secondary | ICD-10-CM | POA: Diagnosis not present

## 2022-09-14 DIAGNOSIS — M9901 Segmental and somatic dysfunction of cervical region: Secondary | ICD-10-CM | POA: Diagnosis not present

## 2022-09-14 DIAGNOSIS — M5413 Radiculopathy, cervicothoracic region: Secondary | ICD-10-CM | POA: Diagnosis not present

## 2022-09-14 DIAGNOSIS — M9903 Segmental and somatic dysfunction of lumbar region: Secondary | ICD-10-CM | POA: Diagnosis not present

## 2022-09-14 DIAGNOSIS — M542 Cervicalgia: Secondary | ICD-10-CM | POA: Diagnosis not present

## 2022-09-21 DIAGNOSIS — M9901 Segmental and somatic dysfunction of cervical region: Secondary | ICD-10-CM | POA: Diagnosis not present

## 2022-09-21 DIAGNOSIS — M542 Cervicalgia: Secondary | ICD-10-CM | POA: Diagnosis not present

## 2022-09-21 DIAGNOSIS — M5413 Radiculopathy, cervicothoracic region: Secondary | ICD-10-CM | POA: Diagnosis not present

## 2022-09-21 DIAGNOSIS — M9903 Segmental and somatic dysfunction of lumbar region: Secondary | ICD-10-CM | POA: Diagnosis not present

## 2022-09-28 DIAGNOSIS — M9901 Segmental and somatic dysfunction of cervical region: Secondary | ICD-10-CM | POA: Diagnosis not present

## 2022-09-28 DIAGNOSIS — M9903 Segmental and somatic dysfunction of lumbar region: Secondary | ICD-10-CM | POA: Diagnosis not present

## 2022-09-28 DIAGNOSIS — M5413 Radiculopathy, cervicothoracic region: Secondary | ICD-10-CM | POA: Diagnosis not present

## 2022-09-28 DIAGNOSIS — M542 Cervicalgia: Secondary | ICD-10-CM | POA: Diagnosis not present

## 2022-10-15 ENCOUNTER — Encounter: Payer: Self-pay | Admitting: Gastroenterology

## 2022-10-18 ENCOUNTER — Telehealth: Payer: BC Managed Care – PPO | Admitting: Family

## 2022-10-18 DIAGNOSIS — J069 Acute upper respiratory infection, unspecified: Secondary | ICD-10-CM | POA: Diagnosis not present

## 2022-10-18 MED ORDER — DOXYCYCLINE HYCLATE 100 MG PO TABS: 100.0000 mg | ORAL_TABLET | Freq: Two times a day (BID) | ORAL | 0 refills | Status: DC

## 2022-10-18 MED ORDER — BENZONATATE 100 MG PO CAPS: 100.0000 mg | ORAL_CAPSULE | Freq: Three times a day (TID) | ORAL | 0 refills | Status: DC | PRN

## 2022-10-18 MED ORDER — FLUTICASONE PROPIONATE 50 MCG/ACT NA SUSP: 2.0000 | Freq: Every day | NASAL | 6 refills | Status: DC

## 2022-10-18 NOTE — Progress Notes (Signed)

## 2022-10-18 NOTE — Addendum Note (Signed)
Addended by: Jannifer Rodney A on: 10/18/2022 09:16 AM   Modules accepted: Orders

## 2022-10-20 DIAGNOSIS — M9901 Segmental and somatic dysfunction of cervical region: Secondary | ICD-10-CM | POA: Diagnosis not present

## 2022-10-20 DIAGNOSIS — M542 Cervicalgia: Secondary | ICD-10-CM | POA: Diagnosis not present

## 2022-10-20 DIAGNOSIS — M9903 Segmental and somatic dysfunction of lumbar region: Secondary | ICD-10-CM | POA: Diagnosis not present

## 2022-10-20 DIAGNOSIS — M5413 Radiculopathy, cervicothoracic region: Secondary | ICD-10-CM | POA: Diagnosis not present

## 2022-10-31 ENCOUNTER — Other Ambulatory Visit: Payer: Self-pay | Admitting: Family

## 2022-11-03 DIAGNOSIS — M9903 Segmental and somatic dysfunction of lumbar region: Secondary | ICD-10-CM | POA: Diagnosis not present

## 2022-11-03 DIAGNOSIS — M5413 Radiculopathy, cervicothoracic region: Secondary | ICD-10-CM | POA: Diagnosis not present

## 2022-11-03 DIAGNOSIS — M542 Cervicalgia: Secondary | ICD-10-CM | POA: Diagnosis not present

## 2022-11-03 DIAGNOSIS — M9901 Segmental and somatic dysfunction of cervical region: Secondary | ICD-10-CM | POA: Diagnosis not present

## 2022-11-06 ENCOUNTER — Telehealth: Payer: BC Managed Care – PPO | Admitting: Physician Assistant

## 2022-11-06 DIAGNOSIS — B9789 Other viral agents as the cause of diseases classified elsewhere: Secondary | ICD-10-CM

## 2022-11-06 DIAGNOSIS — J019 Acute sinusitis, unspecified: Secondary | ICD-10-CM

## 2022-11-06 MED ORDER — FLUTICASONE PROPIONATE 50 MCG/ACT NA SUSP: 2.0000 | Freq: Every day | NASAL | 0 refills | Status: DC

## 2022-11-06 MED ORDER — BENZONATATE 100 MG PO CAPS: 100.0000 mg | ORAL_CAPSULE | Freq: Three times a day (TID) | ORAL | 0 refills | Status: DC | PRN

## 2022-11-06 NOTE — Progress Notes (Signed)
E-Visit for Sinus Problems  We are sorry that you are not feeling well.  Here is how we plan to help!  Based on what you have shared with me it looks like you have sinusitis.  Sinusitis is inflammation and infection in the sinus cavities of the head.  Based on your presentation I believe you most likely have Acute Viral Sinusitis.This is an infection most likely caused by a virus. There is not specific treatment for viral sinusitis other than to help you with the symptoms until the infection runs its course.  You may use an oral decongestant such as Mucinex D or if you have glaucoma or high blood pressure use plain Mucinex. Saline nasal spray help and can safely be used as often as needed for congestion, I have prescribed: Fluticasone nasal spray two sprays in each nostril once a day and Tessalon perles for cough.  Some authorities believe that zinc sprays or the use of Echinacea may shorten the course of your symptoms.  Sinus infections are not as easily transmitted as other respiratory infection, however we still recommend that you avoid close contact with loved ones, especially the very young and elderly.  Remember to wash your hands thoroughly throughout the day as this is the number one way to prevent the spread of infection!  Home Care: Only take medications as instructed by your medical team. Do not take these medications with alcohol. A steam or ultrasonic humidifier can help congestion.  You can place a towel over your head and breathe in the steam from hot water coming from a faucet. Avoid close contacts especially the very young and the elderly. Cover your mouth when you cough or sneeze. Always remember to wash your hands.  Get Help Right Away If: You develop worsening fever or sinus pain. You develop a severe head ache or visual changes. Your symptoms persist after you have completed your treatment plan.  Make sure you Understand these instructions. Will watch your  condition. Will get help right away if you are not doing well or get worse.   Thank you for choosing an e-visit.  Your e-visit answers were reviewed by a board certified advanced clinical practitioner to complete your personal care plan. Depending upon the condition, your plan could have included both over the counter or prescription medications.  Please review your pharmacy choice. Make sure the pharmacy is open so you can pick up prescription now. If there is a problem, you may contact your provider through MyChart messaging and have the prescription routed to another pharmacy.  Your safety is important to us. If you have drug allergies check your prescription carefully.   For the next 24 hours you can use MyChart to ask questions about today's visit, request a non-urgent call back, or ask for a work or school excuse. You will get an email in the next two days asking about your experience. I hope that your e-visit has been valuable and will speed your recovery.  I have spent 5 minutes in review of e-visit questionnaire, review and updating patient chart, medical decision making and response to patient.   Caileigh Canche M Zacari Radick, PA-C  

## 2022-12-16 ENCOUNTER — Ambulatory Visit (INDEPENDENT_AMBULATORY_CARE_PROVIDER_SITE_OTHER): Payer: BC Managed Care – PPO | Admitting: Family

## 2022-12-16 ENCOUNTER — Encounter: Payer: Self-pay | Admitting: Family

## 2022-12-16 ENCOUNTER — Other Ambulatory Visit: Payer: Self-pay | Admitting: Family

## 2022-12-16 VITALS — BP 130/75 | HR 76 | Temp 98.0°F | Resp 16 | Ht 61.5 in | Wt 165.4 lb

## 2022-12-16 DIAGNOSIS — E039 Hypothyroidism, unspecified: Secondary | ICD-10-CM

## 2022-12-16 DIAGNOSIS — N393 Stress incontinence (female) (male): Secondary | ICD-10-CM

## 2022-12-16 DIAGNOSIS — Z0001 Encounter for general adult medical examination with abnormal findings: Secondary | ICD-10-CM | POA: Diagnosis not present

## 2022-12-16 DIAGNOSIS — I10 Essential (primary) hypertension: Secondary | ICD-10-CM | POA: Diagnosis not present

## 2022-12-16 DIAGNOSIS — E785 Hyperlipidemia, unspecified: Secondary | ICD-10-CM | POA: Diagnosis not present

## 2022-12-16 DIAGNOSIS — Z0000 Encounter for general adult medical examination without abnormal findings: Secondary | ICD-10-CM

## 2022-12-16 LAB — LIPID PANEL
Cholesterol: 216 mg/dL — ABNORMAL HIGH (ref 0–200)
HDL: 62.5 mg/dL (ref >39.00)
LDL Cholesterol: 137 mg/dL — ABNORMAL HIGH (ref 0–99)
NonHDL: 153.97
Total CHOL/HDL Ratio: 3
Triglycerides: 87 mg/dL (ref 0.0–149.0)
VLDL: 17.4 mg/dL (ref 0.0–40.0)

## 2022-12-16 LAB — COMPREHENSIVE METABOLIC PANEL
ALT: 12 U/L (ref 0–35)
AST: 12 U/L (ref 0–37)
Albumin: 4.4 g/dL (ref 3.5–5.2)
Alkaline Phosphatase: 63 U/L (ref 39–117)
BUN: 21 mg/dL (ref 6–23)
CO2: 32 mEq/L (ref 19–32)
Calcium: 9.6 mg/dL (ref 8.4–10.5)
Chloride: 99 mEq/L (ref 96–112)
Creatinine, Ser: 0.79 mg/dL (ref 0.40–1.20)
GFR: 81.35 mL/min (ref >60.00)
Glucose, Bld: 93 mg/dL (ref 70–99)
Potassium: 4.4 mEq/L (ref 3.5–5.1)
Sodium: 138 mEq/L (ref 135–145)
Total Bilirubin: 0.3 mg/dL (ref 0.2–1.2)
Total Protein: 6.6 g/dL (ref 6.0–8.3)

## 2022-12-16 LAB — TSH: TSH: 3.49 u[IU]/mL (ref 0.35–5.50)

## 2022-12-16 NOTE — Assessment & Plan Note (Signed)
BP Readings from Last 3 Encounters:  12/16/22 130/75  12/15/21 126/77  12/13/20 121/86   At goal on hctz, continue same.

## 2022-12-16 NOTE — Assessment & Plan Note (Signed)
Wt Readings from Last 3 Encounters:  12/16/22 165 lb 6 oz (75 kg)  12/15/21 159 lb (72.1 kg)  12/13/20 166 lb 12.8 oz (75.7 kg)   Discussed healthy diet and exercise.

## 2022-12-16 NOTE — Assessment & Plan Note (Signed)
Continues synthroid, some fatigue. Check TSH.

## 2022-12-16 NOTE — Progress Notes (Signed)
Subjective:   By signing my name below, I, Shehryar Baig, attest that this documentation has been prepared under the direction and in the presence of Sandford Craze, NP. 12/16/2022   Patient ID: Tara Hamilton, female    DOB: [birth year], 62 y.o.   MRN: [id number]  Chief Complaint  Patient presents with   Annual Exam    Fasting, Scheduled for cscope 3/11 w/ Dr. Lavon Paganini    HPI Patient is in today for a comprehensive physical exam.  Urinary incontinence- She continues having urinary incontinence since her hysterectomy. She has not seen a urogynocologist to manage her symptoms.  Blood pressure: Her blood pressure is stable during this visit while taking 21.5 mg hydrochlorothiazide daily PO. BP Readings from Last 3 Encounters:  12/16/22 130/75  12/15/21 126/77  12/13/20 121/86   Pulse Readings from Last 3 Encounters:  12/16/22 76  12/15/21 77  12/13/20 75   Thyroid: She continues taking 75 mcg synthroid daily PO and reports no new issues while taking it. She notes having mild fatigue recently but thinks it is unrelated to her thyroid.  Cholesterol: Her cholesterol was elevated during her last blood work. She is interested in checking her cholesterol levels during her next blood work.  Lab Results  Component Value Date   CHOL 212 (H) 12/15/2021   HDL 63.70 12/15/2021   LDLCALC 128 (H) 12/15/2021   TRIG 100.0 12/15/2021   CHOLHDL 3 12/15/2021   Acute: She denies having any unexpected weight change, ear pain, hearing loss and rhinorrhea, visual disturbance, cough, chest pain and leg swelling, nausea, vomiting, diarrhea, constipation, blood in stool, or dysuria and frequency, for myalgias and arthralgias, rash, headaches, adenopathy, current concerns of depression or anxiety at this time.  Social history: She has no new surgical procedures to report. Her mother passed away this past year from a tumor in her transversal ligament. Her brother and sister have a history of  diabetes. Her maternal grandmother has a history of heart disease, her maternal grandfather has a history of emphysema from smoking. Otherwise she has no changes to her family medical history.   Immunizations: She is UTD on flu vaccine. She is UTD on pneumonia vaccine, tetanus vaccine, and both shingles vaccines. She is not interested in receiving the Covid-19 booster vaccine.   Diet: She is not managing a healthy diet at this time.   Exercise: She is not participating in regular exercise.   Colonoscopy: Last completed 11/16/2012. She has an upcoming colonoscopy scheduled March 2024  Dexa: Last completed 06/08/2019. Result show she is osteopenic. Repeat in 2 years.  Mammogram: Last completed 04/20/2022. Results are normal. Repeat in 1 year.   Dental: She is UTD on dental care.   Vision: She is UTD on vision care.    Past Medical History:  Diagnosis Date   Allergy    Hypertension    Osteopenia     Past Surgical History:  Procedure Laterality Date   ABDOMINAL HYSTERECTOMY  2000   TONSILLECTOMY      Family History  Problem Relation Age of Onset   Hypertension Mother    Stroke Mother 19   Peripheral vascular disease Mother    Hypertension Father        died from suicide   Diabetes Sister    Diabetes Brother    COPD Maternal Grandmother    COPD Maternal Grandfather    Heart disease Paternal Grandmother    Cancer Maternal Aunt 1       uterine  Thyroid disease Neg Hx     Social History   Socioeconomic History   Marital status: Divorced    Spouse name: Not on file   Number of children: Not on file   Years of education: Not on file   Highest education level: Not on file  Occupational History   Not on file  Tobacco Use   Smoking status: Never   Smokeless tobacco: Never  Vaping Use   Vaping Use: Never used  Substance and Sexual Activity   Alcohol use: Yes    Alcohol/week: 1.0 standard drink of alcohol    Types: 1 Glasses of wine per week    Comment: occasion    Drug use: No   Sexual activity: Not Currently  Other Topics Concern   Not on file  Social History Narrative   From here, recently moved back from 20 years in [place].    Works as a Radio broadcast assistant   2 grown sons one in [place], other son lives in [place]    One grandchild in [place] Theatre manager)   Enjoys Social worker, church, reading   Social Determinants of Radio broadcast assistant Strain: Not on file  Food Insecurity: Not on file  Transportation Needs: Not on file  Physical Activity: Not on file  Stress: Not on file  Social Connections: Not on file  Intimate Partner Violence: Not on file    Outpatient Medications Prior to Visit  Medication Sig Dispense Refill   Ascorbic Acid (VITAMIN C) 1000 MG tablet Take 1,000 mg by mouth daily.     Calcium Carbonate-Vitamin D (TGT CALCIUM DIETARY SUPPLEMENT PO) Take 1 tablet by mouth daily.     estradiol (ESTRACE) 0.5 MG tablet Take 2 tablets (1 mg total) by mouth daily. 90 tablet 1   fexofenadine (ALLEGRA) 180 MG tablet Take 180 mg by mouth daily.     hydrochlorothiazide (MICROZIDE) 12.5 MG capsule Take 1 capsule (12.5 mg total) by mouth daily. 90 capsule 2   levothyroxine (SYNTHROID) 75 MCG tablet Take 1 tablet by mouth once daily 90 tablet 0   Multiple Vitamins-Minerals (MULTIVITAMIN ADULT PO) Take 1 tablet by mouth daily.     benzonatate (TESSALON) 100 MG capsule Take 1 capsule (100 mg total) by mouth 3 (three) times daily as needed. (Patient not taking: Reported on 12/16/2022) 30 capsule 0   doxycycline (VIBRA-TABS) 100 MG tablet Take 1 tablet (100 mg total) by mouth 2 (two) times daily. (Patient not taking: Reported on 12/16/2022) 20 tablet 0   fluticasone (FLONASE) 50 MCG/ACT nasal spray Place 2 sprays into both nostrils daily. (Patient not taking: Reported on 12/16/2022) 16 g 0   No facility-administered medications prior to visit.    Allergies  Allergen Reactions   Penicillins Swelling    Review of Systems   Constitutional:  Negative for fever.       (-)unexpected weight change (-)Adenopathy  HENT:  Negative for congestion, sinus pain and sore throat.   Eyes:        (-)Visual disturbance  Respiratory:  Negative for cough, shortness of breath and wheezing.   Cardiovascular:  Negative for chest pain, palpitations and leg swelling.  Gastrointestinal:  Negative for blood in stool, constipation, diarrhea, nausea and vomiting.  Genitourinary:  Negative for dysuria, frequency and hematuria.  Musculoskeletal:        (-)new muscle pain (-)new joint pain  Skin:        (-)new moles  Neurological:  Negative for dizziness and  headaches.  Psychiatric/Behavioral:  Negative for depression. The patient is not nervous/anxious.        Objective:    Physical Exam Constitutional:      General: She is not in acute distress.    Appearance: Normal appearance. She is not ill-appearing.  HENT:     Head: Normocephalic and atraumatic.     Right Ear: Ear canal and external ear normal. There is impacted cerumen.     Left Ear: Tympanic membrane, ear canal and external ear normal.     Mouth/Throat:     Mouth: Mucous membranes are moist.     Pharynx: Oropharynx is clear. No oropharyngeal exudate or posterior oropharyngeal erythema.  Eyes:     Extraocular Movements: Extraocular movements intact.     Right eye: No nystagmus.     Left eye: No nystagmus.     Pupils: Pupils are equal, round, and reactive to light.  Neck:     Thyroid: No thyromegaly or thyroid tenderness.  Cardiovascular:     Rate and Rhythm: Normal rate and regular rhythm.     Heart sounds: Normal heart sounds. No murmur heard.    No gallop.  Pulmonary:     Effort: Pulmonary effort is normal. No respiratory distress.     Breath sounds: Normal breath sounds. No wheezing or rales.  Abdominal:     General: Bowel sounds are normal. There is no distension.     Palpations: Abdomen is soft.     Tenderness: There is no abdominal tenderness. There  is no guarding.  Musculoskeletal:     Comments: 5/5 strength in both upper and lower extremities  Lymphadenopathy:     Cervical: No cervical adenopathy.  Skin:    General: Skin is warm and dry.  Neurological:     Mental Status: She is alert and oriented to person, place, and time.     Deep Tendon Reflexes:     Reflex Scores:      Patellar reflexes are 2+ on the right side and 2+ on the left side. Psychiatric:        Judgment: Judgment normal.     BP 130/75   Pulse 76   Temp 98 F (36.7 C) (Oral)   Resp 16   Ht 5' 1.5" (1.562 m)   Wt 165 lb 6 oz (75 kg)   SpO2 99%   BMI 30.74 kg/m  Wt Readings from Last 3 Encounters:  12/16/22 165 lb 6 oz (75 kg)  12/15/21 159 lb (72.1 kg)  12/13/20 166 lb 12.8 oz (75.7 kg)       Assessment & Plan:  Stress incontinence -     Ambulatory referral to Urogynecology  Preventative health care Assessment & Plan: Wt Readings from Last 3 Encounters:  12/16/22 165 lb 6 oz (75 kg)  12/15/21 159 lb (72.1 kg)  12/13/20 166 lb 12.8 oz (75.7 kg)   Discussed healthy diet and exercise.    Primary hypertension Assessment & Plan: BP Readings from Last 3 Encounters:  12/16/22 130/75  12/15/21 126/77  12/13/20 121/86   At goal on hctz, continue same.   Orders: -     Comprehensive metabolic panel  Hypothyroidism, unspecified type Assessment & Plan: Continues synthroid, some fatigue. Check TSH.   Orders: -     TSH  Hyperlipidemia, unspecified hyperlipidemia type -     Lipid panel    I, Nance Pear, NP, personally preformed the services described in this documentation.  All medical record entries made by  the scribe were at my direction and in my presence.  I have reviewed the chart and discharge instructions (if applicable) and agree that the record reflects my personal performance and is accurate and complete. 12/16/2022   I,Shehryar Baig,acting as a Education administrator for Nance Pear, NP.,have documented all relevant  documentation on the behalf of Nance Pear, NP,as directed by  Nance Pear, NP while in the presence of Nance Pear, NP.   Nance Pear, NP

## 2022-12-17 MED ORDER — LEVOTHYROXINE SODIUM 75 MCG PO TABS: 75.0000 ug | ORAL_TABLET | Freq: Every day | ORAL | 0 refills | Status: DC

## 2022-12-17 MED ORDER — HYDROCHLOROTHIAZIDE 12.5 MG PO CAPS: 12.5000 mg | ORAL_CAPSULE | Freq: Every day | ORAL | 2 refills | Status: DC

## 2022-12-24 ENCOUNTER — Encounter: Payer: Self-pay | Admitting: Family

## 2022-12-29 ENCOUNTER — Ambulatory Visit (AMBULATORY_SURGERY_CENTER): Payer: BC Managed Care – PPO | Admitting: *Deleted

## 2022-12-29 VITALS — Ht 61.5 in | Wt 160.0 lb

## 2022-12-29 DIAGNOSIS — Z1211 Encounter for screening for malignant neoplasm of colon: Secondary | ICD-10-CM

## 2022-12-29 MED ORDER — NA SULFATE-K SULFATE-MG SULF 17.5-3.13-1.6 GM/177ML PO SOLN: 1.0000 | Freq: Once | ORAL | 0 refills | Status: AC

## 2022-12-29 NOTE — Progress Notes (Signed)

## 2022-12-31 ENCOUNTER — Encounter: Payer: Self-pay | Admitting: Family

## 2023-01-01 ENCOUNTER — Encounter: Payer: Self-pay | Admitting: Obstetrics and Gynecology

## 2023-01-01 ENCOUNTER — Ambulatory Visit (INDEPENDENT_AMBULATORY_CARE_PROVIDER_SITE_OTHER): Payer: BC Managed Care – PPO | Admitting: Obstetrics and Gynecology

## 2023-01-01 VITALS — BP 148/92 | HR 80 | Ht 60.0 in | Wt 167.0 lb

## 2023-01-01 DIAGNOSIS — N393 Stress incontinence (female) (male): Secondary | ICD-10-CM

## 2023-01-01 DIAGNOSIS — R350 Frequency of micturition: Secondary | ICD-10-CM

## 2023-01-01 LAB — POCT URINALYSIS DIPSTICK
Bilirubin, UA: NEGATIVE
Blood, UA: NEGATIVE
Glucose, UA: NEGATIVE
Ketones, UA: NEGATIVE
Nitrite, UA: NEGATIVE
Protein, UA: NEGATIVE
Spec Grav, UA: 1.02 (ref 1.010–1.025)
Urobilinogen, UA: 0.2 E.U./dL
pH, UA: 7 (ref 5.0–8.0)

## 2023-01-01 MED ORDER — ESTRADIOL 0.5 MG PO TABS: 0.5000 mg | ORAL_TABLET | Freq: Every day | ORAL | Status: DC

## 2023-01-01 NOTE — Progress Notes (Signed)
[facility] New Patient Evaluation and Consultation  Referring Provider: Debbrah Alar, NP PCP: Debbrah Alar, NP Date of Service: 01/01/2023  SUBJECTIVE Chief Complaint: New Patient (Initial Visit) Tara Hamilton is a 62 y.o. female here for a consult for stress incontinence and urinary urgency.)  History of Present Illness: Tara Hamilton is a 62 y.o. White or Caucasian female seen in consultation at the request of NP Debbrah Alar for evaluation of incontinence.    Review of records significant for: Has had urinary incontinence since having a hysterectomy  Urinary Symptoms: Leaks urine with cough/ sneeze, laughing, exercise, and with a full bladder Leaks a few time(s) per day. SUI > UUI, cannot hold her urine with exercise.  Pad use: 1 pads per day.   She is bothered by her UI symptoms.  Day time voids 10+, does not usually have urgency, just drinks a lot.  Nocturia: 1-2 times per night to void. Voiding dysfunction: she empties her bladder well.  does not use a catheter to empty bladder.  When urinating, she feels dribbling after finishing Drinks: 1-2 cups coffee, 80oz water per day  UTIs:  0  UTI's in the last year.   Denies history of blood in urine and kidney or bladder stones  Pelvic Organ Prolapse Symptoms:                  She Denies a feeling of a bulge the vaginal area.   Bowel Symptom: Bowel movements: 1 time(s) per day Stool consistency: soft  Straining: no.  Splinting: no.  Incomplete evacuation: no.  Denies accidental bowel leakage / fecal incontinence Bowel regimen:  magnesium  Sexual Function Sexually active: no.   Pelvic Pain Denies pelvic pain   Past Medical History:  Past Medical History:  Diagnosis Date   Allergy    Asthma    as a child   Hyperlipidemia    borderline   Hypertension    hypothyroid    Osteopenia      Past Surgical History:   Past Surgical History:  Procedure Laterality Date    ABDOMINAL HYSTERECTOMY  2000   TONSILLECTOMY       Past OB/GYN History: OB History  Gravida Para Term Preterm AB Living  2 2 2     2  $ SAB IAB Ectopic Multiple Live Births          2    # Outcome Date GA Lbr Len/2nd Weight Sex Delivery Anes PTL Lv  2 Term      Vag-Spont     1 Term      Vag-Spont      S/p hysterectomy   Medications: She has a current medication list which includes the following prescription(s): vitamin c, calcium carb-cholecalciferol, estradiol, fexofenadine, hydrochlorothiazide, levothyroxine, and multiple vitamin.   Allergies: Patient is allergic to penicillins.   Social History:  Social History   Tobacco Use   Smoking status: Never   Smokeless tobacco: Never  Vaping Use   Vaping Use: Never used  Substance Use Topics   Alcohol use: Yes    Alcohol/week: 1.0 standard drink of alcohol    Types: 1 Glasses of wine per week    Comment: occasion   Drug use: No    Relationship status: divorced She lives alone She is employed as a Herbalist. Regular exercise: No History of abuse: No  Family History:   Family History  Problem Relation Age of Onset   Hypertension Mother    Stroke Mother  96   Peripheral vascular disease Mother    Hypertension Father        died from suicide   Diabetes Sister    Diabetes Brother    Cancer Maternal Aunt 46       uterine   COPD Maternal Grandmother    COPD Maternal Grandfather    Heart disease Paternal Grandmother    Thyroid disease Neg Hx    Colon cancer Neg Hx    Colon polyps Neg Hx    Crohn's disease Neg Hx    Esophageal cancer Neg Hx    Rectal cancer Neg Hx    Stomach cancer Neg Hx    Ulcerative colitis Neg Hx      Review of Systems: Review of Systems  Constitutional:  Negative for fever, malaise/fatigue and weight loss.  Respiratory:  Negative for cough, shortness of breath and wheezing.   Cardiovascular:  Negative for chest pain, palpitations and leg swelling.  Gastrointestinal:  Negative for  abdominal pain and blood in stool.  Genitourinary:  Negative for dysuria.  Musculoskeletal:  Negative for myalgias.  Skin:  Negative for rash.  Neurological:  Negative for dizziness and headaches.  Endo/Heme/Allergies:  Does not bruise/bleed easily.  Psychiatric/Behavioral:  Negative for depression. The patient is not nervous/anxious.      OBJECTIVE Physical Exam: Vitals:   01/01/23 1426 01/01/23 1509  BP: (!) 154/96 (!) 148/92  Pulse: 87 80  Weight: 167 lb (75.8 kg)   Height: 5' (1.524 m)     Physical Exam Constitutional:      General: She is not in acute distress. Pulmonary:     Effort: Pulmonary effort is normal.  Abdominal:     General: There is no distension.     Palpations: Abdomen is soft.     Tenderness: There is no abdominal tenderness. There is no rebound.  Musculoskeletal:        General: No swelling. Normal range of motion.  Skin:    General: Skin is warm and dry.     Findings: No rash.  Neurological:     Mental Status: She is alert and oriented to person, place, and time.  Psychiatric:        Mood and Affect: Mood normal.        Behavior: Behavior normal.      GU / Detailed Urogynecologic Evaluation:  Pelvic Exam: Normal external female genitalia; Bartholin's and Skene's glands normal in appearance; urethral meatus normal in appearance, no urethral masses or discharge.   CST: negative  s/p hysterectomy: Speculum exam reveals normal vaginal mucosa with  atrophy and normal vaginal cuff.  Adnexa no mass, fullness, tenderness.     Pelvic floor strength II/V  Pelvic floor musculature: Right levator non-tender, Right obturator non-tender, Left levator non-tender, Left obturator non-tender  POP-Q:   POP-Q  -3                                            Aa   -3                                           Ba  -8  C   3                                            Gh  3                                             Pb  8.5                                            tvl   -3                                            Ap  -3                                            Bp                                                 D      Rectal Exam:  Normal external rectum  Post-Void Residual (PVR) by Bladder Scan: In order to evaluate bladder emptying, we discussed obtaining a postvoid residual and she agreed to this procedure.  Procedure: The ultrasound unit was placed on the patient's abdomen in the suprapubic region after the patient had voided. A PVR of 10 ml was obtained by bladder scan.  Laboratory Results: POC urine: trace leukocytes, otherwise negative  ASSESSMENT AND PLAN Ms. Mule is a 62 y.o. with:  1. SUI (stress urinary incontinence, female)   2. Urinary frequency    SUI - For treatment of stress urinary incontinence,  non-surgical options include expectant management, weight loss, physical therapy, as well as a pessary.  Surgical options include a midurethral sling, Burch urethropexy, and transurethral injection of a bulking agent. - She would like to proceed with a midurethral sling and cystoscopy.  Will need simple CMG to demonstrate leakage at her preop visit.  - We reviewed the patient's specific anatomic and functional findings, with the assistance of diagrams and handouts.  We reviewed the treatment options including expectant management, conservative management, medical management, and surgical management.  We reviewed the benefits and risks of each treatment option. We discussed risks of bleeding, infection, damage to surrounding organs including bowel, bladder, blood vessels, ureters and nerves, need for further surgery, numbness and weakness at any body site, buttock pain, postoperative cognitive dysfunction, and the rarer risks of blood clot, heart attack, pneumonia, death. Also reviewed risk of short and long term catheterization and sling revision.   2. Urinary  frequency - likely due to water intake. She does not experience urgency often so will defer treatment at this time  Request sent for surgery scheduling. Return for pre op and simple CMG.    Jaquita Folds, MD

## 2023-01-01 NOTE — Patient Instructions (Signed)
For treatment of stress urinary incontinence, which is leakage with physical activity/movement/strainging/coughing, we discussed expectant management versus nonsurgical options versus surgery. Nonsurgical options include weight loss, physical therapy, as well as a pessary.  Surgical options include a midurethral sling, which is a synthetic mesh sling that acts like a hammock under the urethra to prevent leakage of urine, and transurethral injection of a bulking agent.

## 2023-01-04 ENCOUNTER — Encounter: Payer: Self-pay | Admitting: *Deleted

## 2023-01-08 ENCOUNTER — Encounter: Payer: Self-pay | Admitting: Gastroenterology

## 2023-01-12 ENCOUNTER — Encounter: Payer: Self-pay | Admitting: Obstetrics and Gynecology

## 2023-01-21 DIAGNOSIS — H43811 Vitreous degeneration, right eye: Secondary | ICD-10-CM | POA: Diagnosis not present

## 2023-01-25 ENCOUNTER — Encounter: Payer: Self-pay | Admitting: Gastroenterology

## 2023-01-25 ENCOUNTER — Ambulatory Visit (AMBULATORY_SURGERY_CENTER): Payer: BC Managed Care – PPO | Admitting: Gastroenterology

## 2023-01-25 VITALS — BP 126/76 | HR 65 | Temp 97.5°F | Resp 18 | Ht 61.0 in | Wt 160.0 lb

## 2023-01-25 DIAGNOSIS — K621 Rectal polyp: Secondary | ICD-10-CM | POA: Diagnosis not present

## 2023-01-25 DIAGNOSIS — D123 Benign neoplasm of transverse colon: Secondary | ICD-10-CM

## 2023-01-25 DIAGNOSIS — Z1211 Encounter for screening for malignant neoplasm of colon: Secondary | ICD-10-CM

## 2023-01-25 DIAGNOSIS — D122 Benign neoplasm of ascending colon: Secondary | ICD-10-CM | POA: Diagnosis not present

## 2023-01-25 DIAGNOSIS — D128 Benign neoplasm of rectum: Secondary | ICD-10-CM

## 2023-01-25 HISTORY — PX: COLONOSCOPY WITH PROPOFOL: SHX5780

## 2023-01-25 MED ORDER — SODIUM CHLORIDE 0.9 % IV SOLN: 500.0000 mL | Freq: Once | INTRAVENOUS | Status: DC

## 2023-01-25 NOTE — Op Note (Addendum)
[facility] Patient Name: Tara Hamilton Procedure Date: 01/25/2023 1:25 PM MRN: [id number] Endoscopist: Mauri Pole , MD, [id number] Age: 62 Referring MD:  Date of Birth: 1961-09-01 Gender: Female Account #: [id number] Procedure:                Colonoscopy Indications:              Screening for colorectal malignant neoplasm Medicines:                Monitored Anesthesia Care Procedure:                Pre-Anesthesia Assessment:                           - Prior to the procedure, a History and Physical                            was performed, and patient medications and                            allergies were reviewed. The patient's tolerance of                            previous anesthesia was also reviewed. The risks                            and benefits of the procedure and the sedation                            options and risks were discussed with the patient.                            All questions were answered, and informed consent                            was obtained. Prior Anticoagulants: The patient has                            taken no anticoagulant or antiplatelet agents. ASA                            Grade Assessment: II - A patient with mild systemic                            disease. After reviewing the risks and benefits,                            the patient was deemed in satisfactory condition to                            undergo the procedure.                           After obtaining informed consent, the colonoscope  was passed under direct vision. Throughout the                            procedure, the patient's blood pressure, pulse, and                            oxygen saturations were monitored continuously. The                            Olympus [id number]-[id number] Colonoscope was                            introduced through the anus and advanced to the the                            cecum,  identified by appendiceal orifice and                            ileocecal valve. The colonoscopy was performed                            without difficulty. The patient tolerated the                            procedure well. The quality of the bowel                            preparation was good. The ileocecal valve,                            appendiceal orifice, and rectum were photographed. Scope In: 1:28:41 PM Scope Out: 1:49:05 PM Scope Withdrawal Time: 0 hours 14 minutes 37 seconds  Total Procedure Duration: 0 hours 20 minutes 24 seconds  Findings:                 The perianal and digital rectal examinations were                            normal.                           Three sessile polyps were found in the rectum,                            transverse colon and ascending colon. The polyps                            were 4 to 6 mm in size. These polyps were removed                            with a cold snare. Resection and retrieval were                            complete.  Scattered medium-mouthed and small-mouthed                            diverticula were found in the sigmoid colon,                            descending colon, transverse colon and ascending                            colon.                           Non-bleeding external and internal hemorrhoids were                            found during retroflexion. The hemorrhoids were                            small. Complications:            No immediate complications. Estimated Blood Loss:     Estimated blood loss was minimal. Impression:               - Three 4 to 6 mm polyps in the rectum, in the                            transverse colon and in the ascending colon,                            removed with a cold snare. Resected and retrieved.                           - Diverticulosis in the sigmoid colon, in the                            descending colon, in the transverse colon  and in                            the ascending colon.                           - Non-bleeding external and internal hemorrhoids.                           - The GI Genius (intelligent endoscopy module),                            computer-aided polyp detection system powered by AI                            was utilized to detect colorectal polyps through                            enhanced visualization during colonoscopy. Recommendation:           - Patient has a contact number available for  emergencies. The signs and symptoms of potential                            delayed complications were discussed with the                            patient. Return to normal activities tomorrow.                            Written discharge instructions were provided to the                            patient.                           - Resume previous diet.                           - Continue present medications.                           - Await pathology results.                           - Repeat colonoscopy in 3 - 5 years for                            surveillance based on pathology results. Mauri Pole, MD 01/25/2023 1:54:18 PM This report has been signed electronically.

## 2023-01-25 NOTE — Progress Notes (Unsigned)
To pacu, VSS. Report to [email or web address] 

## 2023-01-25 NOTE — Progress Notes (Unsigned)
[facility] History and Physical   Primary Care Physician:  Debbrah Alar, NP   Reason for Procedure:  Colorectal cancer screening  Plan:    Screening colonoscopy with possible interventions as needed     HPI: Tara Hamilton is a very pleasant 62 y.o. female here for screening colonoscopy. Denies any nausea, vomiting, abdominal pain, melena or bright red blood per rectum  The risks and benefits as well as alternatives of endoscopic procedure(s) have been discussed and reviewed. All questions answered. The patient agrees to proceed.    Past Medical History:  Diagnosis Date   Allergy    Asthma    as a child   Hyperlipidemia    borderline   Hypertension    hypothyroid    Osteopenia     Past Surgical History:  Procedure Laterality Date   ABDOMINAL HYSTERECTOMY  2000   TONSILLECTOMY      Prior to Admission medications   Medication Sig Start Date End Date Taking? Authorizing Provider  Ascorbic Acid (VITAMIN C) 1000 MG tablet Take 1,000 mg by mouth daily.   Yes [provider]  Calcium Carbonate-Vitamin D (TGT CALCIUM DIETARY SUPPLEMENT PO) Take 1 tablet by mouth daily.   Yes [provider]  estradiol (ESTRACE) 0.5 MG tablet Take 1 tablet (0.5 mg total) by mouth daily. 01/01/23  Yes Debbrah Alar, NP  fexofenadine (ALLEGRA) 180 MG tablet Take 180 mg by mouth daily.   Yes [provider]  hydrochlorothiazide (MICROZIDE) 12.5 MG capsule Take 1 capsule (12.5 mg total) by mouth daily. 12/17/22  Yes Debbrah Alar, NP  levothyroxine (SYNTHROID) 75 MCG tablet Take 1 tablet (75 mcg total) by mouth daily. 12/17/22  Yes Debbrah Alar, NP  Multiple Vitamins-Minerals (MULTIVITAMIN ADULT PO) Take 1 tablet by mouth daily.   Yes [provider]    Current Outpatient Medications  Medication Sig Dispense Refill   Ascorbic Acid (VITAMIN C) 1000 MG tablet Take 1,000 mg by mouth daily.     Calcium Carbonate-Vitamin D (TGT  CALCIUM DIETARY SUPPLEMENT PO) Take 1 tablet by mouth daily.     estradiol (ESTRACE) 0.5 MG tablet Take 1 tablet (0.5 mg total) by mouth daily.     fexofenadine (ALLEGRA) 180 MG tablet Take 180 mg by mouth daily.     hydrochlorothiazide (MICROZIDE) 12.5 MG capsule Take 1 capsule (12.5 mg total) by mouth daily. 90 capsule 2   levothyroxine (SYNTHROID) 75 MCG tablet Take 1 tablet (75 mcg total) by mouth daily. 90 tablet 0   Multiple Vitamins-Minerals (MULTIVITAMIN ADULT PO) Take 1 tablet by mouth daily.     Current Facility-Administered Medications  Medication Dose Route Frequency Provider Last Rate Last Admin   0.9 %  sodium chloride infusion  500 mL Intravenous Once Mauri Pole, MD        Allergies as of 01/25/2023 - Review Complete 01/25/2023  Allergen Reaction Noted   Penicillins Swelling 03/08/2015    Family History  Problem Relation Age of Onset   Hypertension Mother    Stroke Mother 63   Peripheral vascular disease Mother    Hypertension Father        died from suicide   Diabetes Sister    Diabetes Brother    Cancer Maternal Aunt 12       uterine   COPD Maternal Grandmother    COPD Maternal Grandfather    Heart disease Paternal Grandmother    Thyroid disease Neg Hx    Colon cancer Neg Hx  Colon polyps Neg Hx    Crohn's disease Neg Hx    Esophageal cancer Neg Hx    Rectal cancer Neg Hx    Stomach cancer Neg Hx    Ulcerative colitis Neg Hx     Social History   Socioeconomic History   Marital status: Divorced    Spouse name: Not on file   Number of children: Not on file   Years of education: Not on file   Highest education level: Not on file  Occupational History   Not on file  Tobacco Use   Smoking status: Never   Smokeless tobacco: Never  Vaping Use   Vaping Use: Never used  Substance and Sexual Activity   Alcohol use: Yes    Alcohol/week: 1.0 standard drink of alcohol    Types: 1 Glasses of wine per week    Comment: occasion   Drug use: No    Sexual activity: Not Currently  Other Topics Concern   Not on file  Social History Narrative   From here, recently moved back from 20 years in [place].    Works as a Radio broadcast assistant   2 grown sons one in [place], other son lives in [place]    One grandchild in [place] [employer] [place])   Enjoys Social worker, church, reading   Social Determinants of Radio broadcast assistant Strain: Not on [employer] Insecurity: Not on file  Transportation Needs: Not on file  Physical Activity: Not on file  Stress: Not on file  Social Connections: Not on file  Intimate Partner Violence: Not on file    Review of Systems:  All other review of systems negative except as mentioned in the HPI.  Physical Exam: Vital signs in last 24 hours: Blood Pressure 136/66   Pulse 81   Temperature (Abnormal) 97.5 F (36.4 C)   Height '5\' 1"'$  (1.549 m)   Weight 160 lb (72.6 kg)   Oxygen Saturation 100%   Body Mass Index 30.23 kg/m  General:   Alert, NAD Lungs:  Clear .   Heart:  Regular rate and rhythm Abdomen:  Soft, nontender and nondistended. Neuro/Psych:  Alert and cooperative. Normal mood and affect. A and O x 3  Reviewed labs, radiology imaging, old records and pertinent past GI work up  Patient is appropriate for planned procedure(s) and anesthesia in an ambulatory setting   K. Denzil Magnuson , MD [phone]

## 2023-01-25 NOTE — Progress Notes (Signed)
Pt's states no medical or surgical changes since previsit or office visit. 

## 2023-01-25 NOTE — Patient Instructions (Signed)
Resume previous diet Continue present medications Await pathology results Handouts given for polyps, diverticulosis and hemorrhoids  YOU HAD AN ENDOSCOPIC PROCEDURE TODAY AT [facility]:   Refer to the procedure report that was given to you for any specific questions about what was found during the examination.  If the procedure report does not answer your questions, please call your gastroenterologist to clarify.  If you requested that your care partner not be given the details of your procedure findings, then the procedure report has been included in a sealed envelope for you to review at your convenience later.  YOU SHOULD EXPECT: Some feelings of bloating in the abdomen. Passage of more gas than usual.  Walking can help get rid of the air that was put into your GI tract during the procedure and reduce the bloating. If you had a lower endoscopy (such as a colonoscopy or flexible sigmoidoscopy) you may notice spotting of blood in your stool or on the toilet paper. If you underwent a bowel prep for your procedure, you may not have a normal bowel movement for a few days.  Please Note:  You might notice some irritation and congestion in your nose or some drainage.  This is from the oxygen used during your procedure.  There is no need for concern and it should clear up in a day or so.  SYMPTOMS TO REPORT IMMEDIATELY:  Following lower endoscopy (colonoscopy or flexible sigmoidoscopy):  Excessive amounts of blood in the stool  Significant tenderness or worsening of abdominal pains  Swelling of the abdomen that is new, acute  Fever of 100F or higher  For urgent or emergent issues, a gastroenterologist can be reached at any hour by calling [phone]. Do not use MyChart messaging for urgent concerns.   DIET:  We do recommend a small meal at first, but then you may proceed to your regular diet.  Drink plenty of fluids but you should avoid alcoholic beverages for 24  hours.  ACTIVITY:  You should plan to take it easy for the rest of today and you should NOT DRIVE or use heavy machinery until tomorrow (because of the sedation medicines used during the test).    FOLLOW UP: Our staff will call the number listed on your records the next business day following your procedure.  We will call around 7:15- 8:00 am to check on you and address any questions or concerns that you may have regarding the information given to you following your procedure. If we do not reach you, we will leave a message.     If any biopsies were taken you will be contacted by phone or by letter within the next 1-3 weeks.  Please call us at [phone] if you have not heard about the biopsies in 3 weeks.    SIGNATURES/CONFIDENTIALITY: You and/or your care partner have signed paperwork which will be entered into your electronic medical record.  These signatures attest to the fact that that the information above on your After Visit Summary has been reviewed and is understood.  Full responsibility of the confidentiality of this discharge information lies with you and/or your care-partner.

## 2023-01-26 ENCOUNTER — Telehealth: Payer: Self-pay

## 2023-01-26 NOTE — Telephone Encounter (Signed)
  Follow up Call-     01/25/2023   12:50 PM  Call back number  Post procedure Call Back phone  # [phone]  Permission to leave phone message Yes     Patient questions:  Do you have a fever, pain , or abdominal swelling? No. Pain Score  0 *  Have you tolerated food without any problems? Yes.    Have you been able to return to your normal activities? Yes.    Do you have any questions about your discharge instructions: Diet   No. Medications  No. Follow up visit  No.  Do you have questions or concerns about your Care? No.  Actions: * If pain score is 4 or above: No action needed, pain <4.

## 2023-01-29 ENCOUNTER — Encounter: Payer: Self-pay | Admitting: Gastroenterology

## 2023-02-04 NOTE — Progress Notes (Signed)
[facility] Pre-Operative Evaluation   Subjective Chief Complaint: Tara Hamilton presents for a preoperative encounter.   History of Present Illness: Tara Hamilton is a 62 y.o. female who presents for preoperative visit.  She is scheduled to undergo Exam under anesthesia, Midurethral sling, and cystoscopy on 03/01/23.  Her symptoms include stress urinary incontinence, and she was was found to have Stage 0 anterior, Stage 0 posterior, Stage 0 apical prolapse.   Simple CMG showed: CMG showed within normal limits sensation, and within normal limits cystometric capacity. Findings positive for stress incontinence, negative for detrusor overactivity.   Past Medical History:  Diagnosis Date   Allergy    Asthma    as a child   Hyperlipidemia    borderline   Hypertension    hypothyroid    Osteopenia      Past Surgical History:  Procedure Laterality Date   ABDOMINAL HYSTERECTOMY  2000   TONSILLECTOMY      is allergic to penicillins.   Family History  Problem Relation Age of Onset   Hypertension Mother    Stroke Mother 75   Peripheral vascular disease Mother    Hypertension Father        died from suicide   Diabetes Sister    Diabetes Brother    Cancer Maternal Aunt 48       uterine   COPD Maternal Grandmother    COPD Maternal Grandfather    Heart disease Paternal Grandmother    Thyroid disease Neg Hx    Colon cancer Neg Hx    Colon polyps Neg Hx    Crohn's disease Neg Hx    Esophageal cancer Neg Hx    Rectal cancer Neg Hx    Stomach cancer Neg Hx    Ulcerative colitis Neg Hx     Social History   Tobacco Use   Smoking status: Never   Smokeless tobacco: Never  Vaping Use   Vaping Use: Never used  Substance Use Topics   Alcohol use: Yes    Alcohol/week: 1.0 standard drink of alcohol    Types: 1 Glasses of wine per week    Comment: occasion   Drug use: No     Review of Systems was negative for a full 10 system review except as noted in the  History of Present Illness.   Current Outpatient Medications:    Ascorbic Acid (VITAMIN C) 1000 MG tablet, Take 1,000 mg by mouth daily., Disp: , Rfl:    Calcium Carbonate-Vitamin D (TGT CALCIUM DIETARY SUPPLEMENT PO), Take 1 tablet by mouth daily., Disp: , Rfl:    estradiol (ESTRACE) 0.5 MG tablet, Take 1 tablet (0.5 mg total) by mouth daily., Disp: , Rfl:    fexofenadine (ALLEGRA) 180 MG tablet, Take 180 mg by mouth daily., Disp: , Rfl:    hydrochlorothiazide (MICROZIDE) 12.5 MG capsule, Take 1 capsule (12.5 mg total) by mouth daily., Disp: 90 capsule, Rfl: 2   levothyroxine (SYNTHROID) 75 MCG tablet, Take 1 tablet (75 mcg total) by mouth daily., Disp: 90 tablet, Rfl: 0   Multiple Vitamins-Minerals (MULTIVITAMIN ADULT PO), Take 1 tablet by mouth daily., Disp: , Rfl:    Objective There were no vitals filed for this visit.  Gen: NAD CV: S1 S2 RRR Lungs: Clear to auscultation bilaterally Abd: soft, nontender   Previous Pelvic Exam showed: POP-Q   -3  Aa   -3                                           Ba   -8                                              C    3                                            Gh   3                                            Pb   8.5                                            tvl    -3                                            Ap   -3                                            Bp                                                  D          Rectal Exam:  Normal external rectum    Assessment/ Plan  Assessment: The patient is a 62 y.o. year old scheduled to undergo Exam under anesthesia, Midurethral sling, and cystoscopy. Verbal consent was obtained for these procedures.  Plan: General Surgical Consent: The patient has previously been counseled on alternative treatments, and the decision by the patient and provider was to proceed with the procedure listed above.  For all procedures, there are  risks of bleeding, infection, damage to surrounding organs including but not limited to bowel, bladder, blood vessels, ureters and nerves, and need for further surgery if an injury were to occur. These risks are all low with minimally invasive surgery.   There are risks of numbness and weakness at any body site or buttock/rectal pain.  It is possible that baseline pain can be worsened by surgery, either with or without mesh. If surgery is vaginal, there is also a low risk of possible conversion to laparoscopy or open abdominal incision where indicated. Very rare risks include blood transfusion, blood clot, heart attack, pneumonia, or death.   There is also a risk of short-term postoperative urinary retention with need to use a catheter. About half of patients need to go home  from surgery with a catheter, which is then later removed in the office. The risk of long-term need for a catheter is very low. There is also a risk of worsening of overactive bladder.   Sling: The effectiveness of a midurethral vaginal mesh sling is approximately 85%, and thus, there will be times when you may leak urine after surgery, especially if your bladder is full or if you have a strong cough. There is a balance between making the sling tight enough to treat your leakage but not too tight so that you have long-term difficulty emptying your bladder. A mesh sling will not directly treat overactive bladder/urge incontinence and may worsen it.  There is an FDA safety notification on vaginal mesh procedures for prolapse but NOT mesh slings. We have extensive experience and training with mesh placement and we have close postoperative follow up to identify any potential complications from mesh. It is important to realize that this mesh is a permanent implant that cannot be easily removed. There are rare risks of mesh exposure (2-4%), pain with intercourse (0-7%), and infection (<1%). The risk of mesh exposure if more likely in a woman  with risks for poor healing (prior radiation, poorly controlled diabetes, or immunocompromised). The risk of new or worsened chronic pain after mesh implant is more common in women with baseline chronic pain and/or poorly controlled anxiety or depression. Approximately 2-4% of patients will experience longer-term post-operative voiding dysfunction that may require surgical revision of the sling. We also reviewed that postoperatively, her stream may not be as strong as before surgery.    We discussed consent for blood products. Risks for blood transfusion include allergic reactions, other reactions that can affect different body organs and managed accordingly, transmission of infectious diseases such as HIV or Hepatitis. However, the blood is screened. Patient consents for blood products.  Pre-operative instructions:  She was instructed to not take Aspirin/NSAIDs x 7days prior to surgery.  Antibiotic prophylaxis was ordered as indicated.  Catheter use: Patient will go home with foley if needed after post-operative voiding trial.  Post-operative instructions:  She was provided with specific post-operative instructions, including precautions and signs/symptoms for which we would recommend contacting us, in addition to daytime and after-hours contact phone numbers. This was provided on a handout.   Post-operative medications: Prescriptions for motrin, tylenol, miralax, and oxycodone were sent to her pharmacy. Discussed using ibuprofen and tylenol on a schedule to limit use of narcotics.   Laboratory testing:  We will check labs as requested by anesthesia.   Preoperative clearance:  She does not require surgical clearance.    Post-operative follow-up:  A post-operative appointment will be made for 6 weeks from the date of surgery. If she needs a post-operative nurse visit for a voiding trial, that will be set up after she leaves the hospital.    Patient will call the clinic or use MyChart should  anything change or any new issues arise.   Berton Mount, NP

## 2023-02-05 ENCOUNTER — Encounter: Payer: Self-pay | Admitting: Obstetrics and Gynecology

## 2023-02-05 ENCOUNTER — Ambulatory Visit (INDEPENDENT_AMBULATORY_CARE_PROVIDER_SITE_OTHER): Payer: BC Managed Care – PPO | Admitting: Obstetrics and Gynecology

## 2023-02-05 VITALS — BP 135/84 | HR 67

## 2023-02-05 DIAGNOSIS — N393 Stress incontinence (female) (male): Secondary | ICD-10-CM | POA: Diagnosis not present

## 2023-02-05 DIAGNOSIS — Z01818 Encounter for other preprocedural examination: Secondary | ICD-10-CM

## 2023-02-05 MED ORDER — ACETAMINOPHEN 500 MG PO TABS: 500.0000 mg | ORAL_TABLET | Freq: Four times a day (QID) | ORAL | 0 refills | Status: DC | PRN

## 2023-02-05 MED ORDER — POLYETHYLENE GLYCOL 3350 17 GM/SCOOP PO POWD: 17.0000 g | Freq: Every day | ORAL | 0 refills | Status: DC

## 2023-02-05 MED ORDER — IBUPROFEN 600 MG PO TABS: 600.0000 mg | ORAL_TABLET | Freq: Four times a day (QID) | ORAL | 0 refills | Status: DC | PRN

## 2023-02-05 MED ORDER — OXYCODONE HCL 5 MG PO TABS: 5.0000 mg | ORAL_TABLET | ORAL | 0 refills | Status: DC | PRN

## 2023-02-05 NOTE — Progress Notes (Signed)
Verbal consent was obtained to perform simple CMG procedure:   Prolapse was reduced using 2 large cotton swabs. Urethra was prepped with betadine and a 83F catheter was placed and bladder was drained completely. The bladder was then backfilled with sterile water by gravity.  First sensation: 75 First Desire: 190 Strong Desire: 220 Capacity: 625 Cough stress test was positive. Valsalva stress test was positive.  She was was allowed to void on her own.   Interpretation: CMG showed within normal limits sensation, and within normal limits cystometric capacity. Findings positive for stress incontinence, negative for detrusor overactivity.

## 2023-02-05 NOTE — H&P (Cosign Needed Addendum)
[facility] H&P  Subjective Chief Complaint: Tara Hamilton presents for a preoperative encounter.   History of Present Illness: Tara Hamilton is a 62 y.o. female who presents for preoperative visit.  She is scheduled to undergo Exam under anesthesia, Midurethral sling, and cystoscopy on 03/01/23.  Her symptoms include stress urinary incontinence, and she was was found to have Stage 0 anterior, Stage 0 posterior, Stage 0 apical prolapse.   Simple CMG showed: CMG showed within normal limits sensation, and within normal limits cystometric capacity. Findings positive for stress incontinence, negative for detrusor overactivity.   Past Medical History:  Diagnosis Date   Allergy    Asthma    as a child   Hyperlipidemia    borderline   Hypertension    hypothyroid    Osteopenia      Past Surgical History:  Procedure Laterality Date   ABDOMINAL HYSTERECTOMY  2000   TONSILLECTOMY      is allergic to penicillins.   Family History  Problem Relation Age of Onset   Hypertension Mother    Stroke Mother 9   Peripheral vascular disease Mother    Hypertension Father        died from suicide   Diabetes Sister    Diabetes Brother    Cancer Maternal Aunt 33       uterine   COPD Maternal Grandmother    COPD Maternal Grandfather    Heart disease Paternal Grandmother    Thyroid disease Neg Hx    Colon cancer Neg Hx    Colon polyps Neg Hx    Crohn's disease Neg Hx    Esophageal cancer Neg Hx    Rectal cancer Neg Hx    Stomach cancer Neg Hx    Ulcerative colitis Neg Hx     Social History   Tobacco Use   Smoking status: Never   Smokeless tobacco: Never  Vaping Use   Vaping Use: Never used  Substance Use Topics   Alcohol use: Yes    Alcohol/week: 1.0 standard drink of alcohol    Types: 1 Glasses of wine per week    Comment: occasion   Drug use: No     Review of Systems was negative for a full 10 system review except as noted in the History of Present  Illness.  No current facility-administered medications for this encounter.  Current Outpatient Medications:    acetaminophen (TYLENOL) 500 MG tablet, Take 1 tablet (500 mg total) by mouth every 6 (six) hours as needed (pain)., Disp: 30 tablet, Rfl: 0   Ascorbic Acid (VITAMIN C) 1000 MG tablet, Take 1,000 mg by mouth daily., Disp: , Rfl:    Calcium Carbonate-Vitamin D (TGT CALCIUM DIETARY SUPPLEMENT PO), Take 1 tablet by mouth daily., Disp: , Rfl:    estradiol (ESTRACE) 0.5 MG tablet, Take 1 tablet (0.5 mg total) by mouth daily., Disp: , Rfl:    fexofenadine (ALLEGRA) 180 MG tablet, Take 180 mg by mouth daily., Disp: , Rfl:    hydrochlorothiazide (MICROZIDE) 12.5 MG capsule, Take 1 capsule (12.5 mg total) by mouth daily., Disp: 90 capsule, Rfl: 2   ibuprofen (ADVIL) 600 MG tablet, Take 1 tablet (600 mg total) by mouth every 6 (six) hours as needed., Disp: 30 tablet, Rfl: 0   levothyroxine (SYNTHROID) 75 MCG tablet, Take 1 tablet (75 mcg total) by mouth daily., Disp: 90 tablet, Rfl: 0   Multiple Vitamins-Minerals (MULTIVITAMIN ADULT PO), Take 1 tablet by mouth daily., Disp: , Rfl:  oxyCODONE (OXY IR/ROXICODONE) 5 MG immediate release tablet, Take 1 tablet (5 mg total) by mouth every 4 (four) hours as needed for severe pain., Disp: 10 tablet, Rfl: 0   polyethylene glycol powder (GLYCOLAX/MIRALAX) 17 GM/SCOOP powder, Take 17 g by mouth daily. Drink 17g (1 scoop) dissolved in water per day., Disp: 255 g, Rfl: 0   Objective There were no vitals filed for this visit.  Gen: NAD CV: S1 S2 RRR Lungs: Clear to auscultation bilaterally Abd: soft, nontender   Previous Pelvic Exam showed: POP-Q   -3                                            Aa   -3                                           Ba   -8                                              C    3                                            Gh   3                                            Pb   8.5                                             tvl    -3                                            Ap   -3                                            Bp                                                  D          Rectal Exam:  Normal external rectum    Assessment/ Plan  Assessment: The patient is a 62 y.o. year old scheduled to undergo Exam under anesthesia, Midurethral sling, and cystoscopy. Verbal consent was obtained for these procedures.

## 2023-02-06 IMAGING — MG MM DIGITAL SCREENING BILAT W/ TOMO AND CAD
8 series · 8 of 24 positions shown · non-contrast
Comparison: Previous exam(s).

CLINICAL DATA: Screening.

EXAM:
DIGITAL SCREENING BILATERAL MAMMOGRAM WITH TOMOSYNTHESIS AND CAD
TECHNIQUE: Bilateral screening digital craniocaudal and mediolateral oblique
mammograms were obtained. Bilateral screening digital breast
tomosynthesis was performed. The images were evaluated with
computer-aided detection.

[R MLO synth-2D]
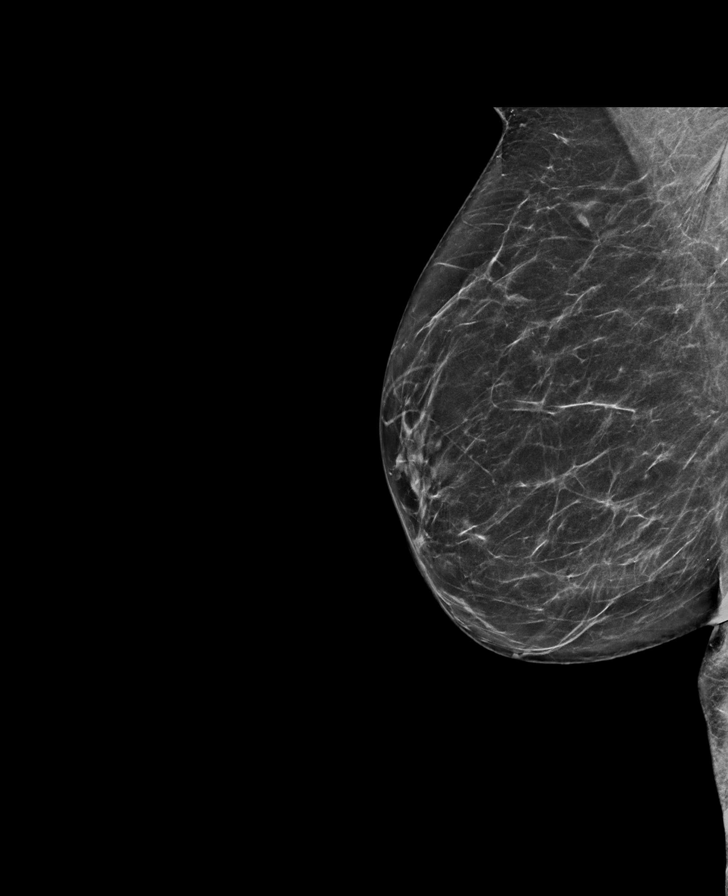

[L MLO synth-2D]
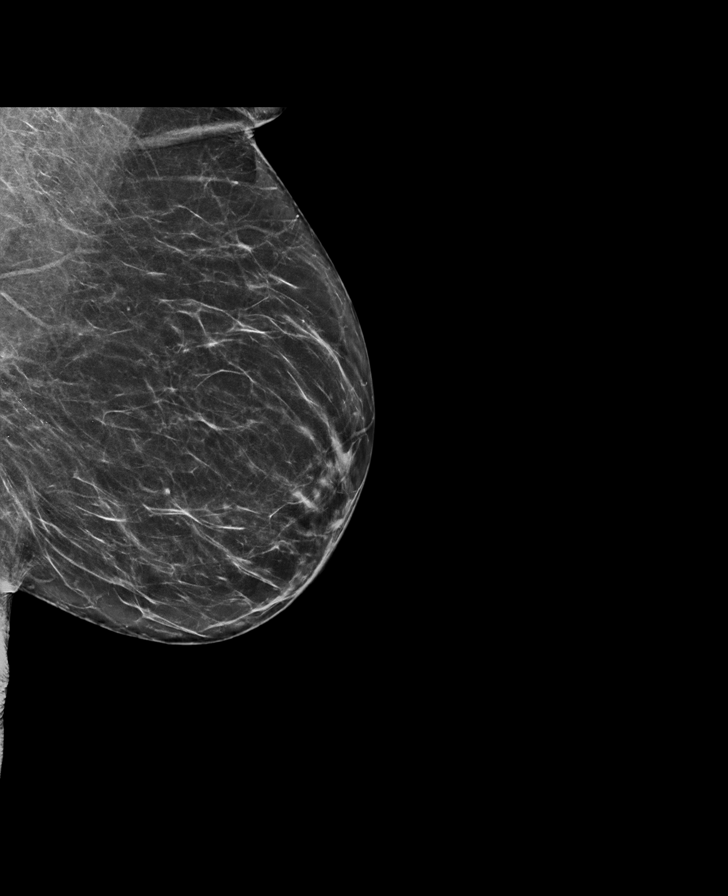

[R CC synth-2D]
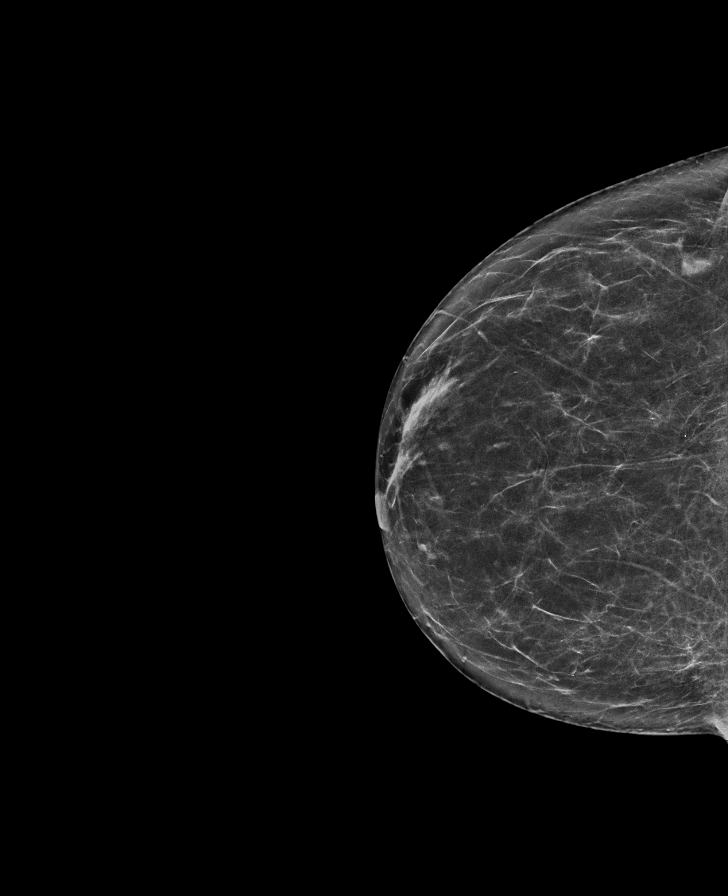

[L CC synth-2D]
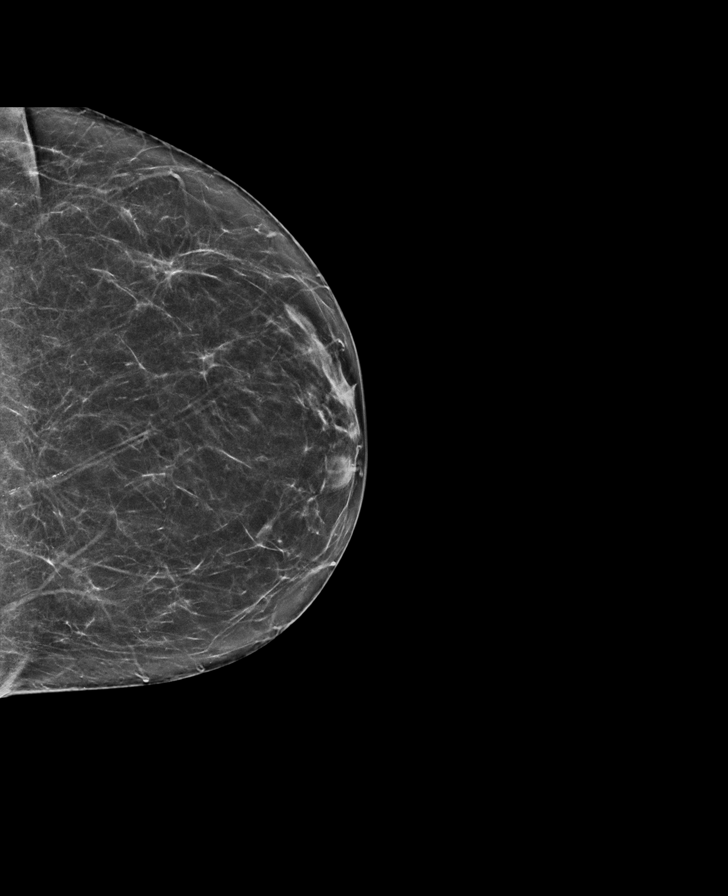

[L CC tomo · tomo slice 31/61.0]
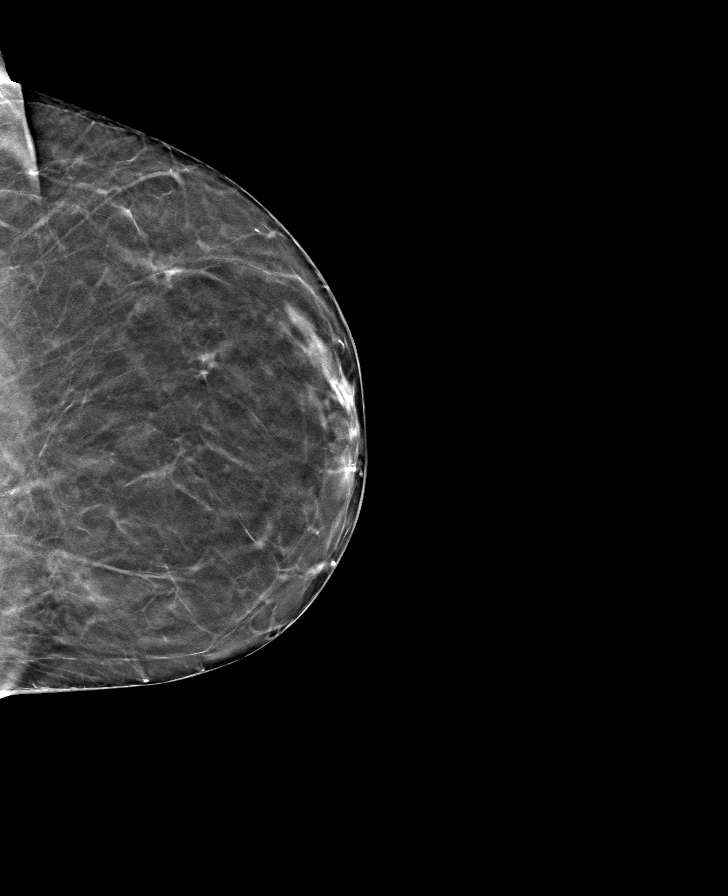

[R CC tomo · tomo slice 33/65.0]
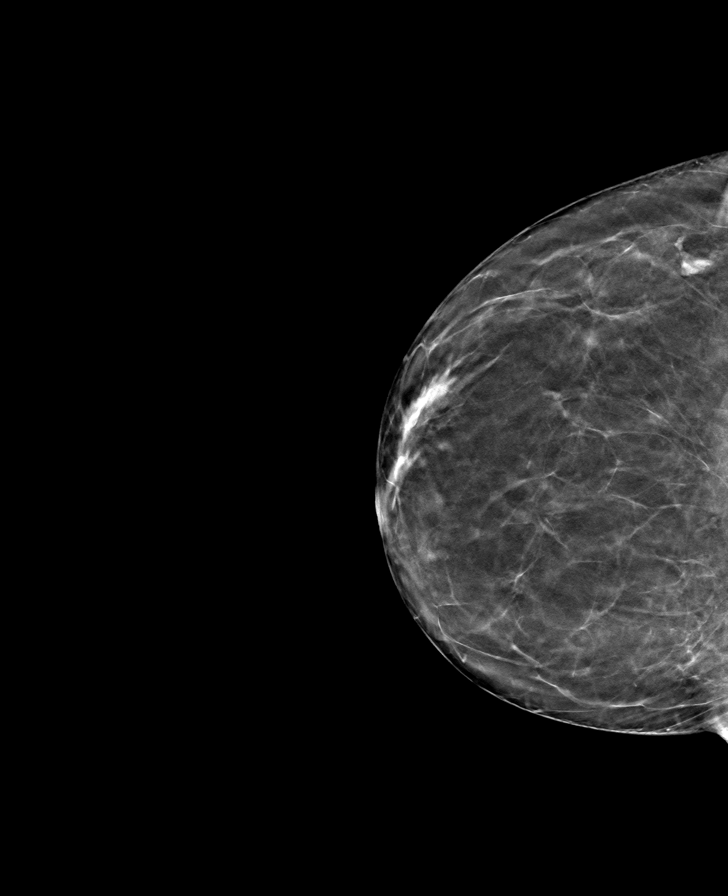

[L MLO tomo · tomo slice 37/72.0]
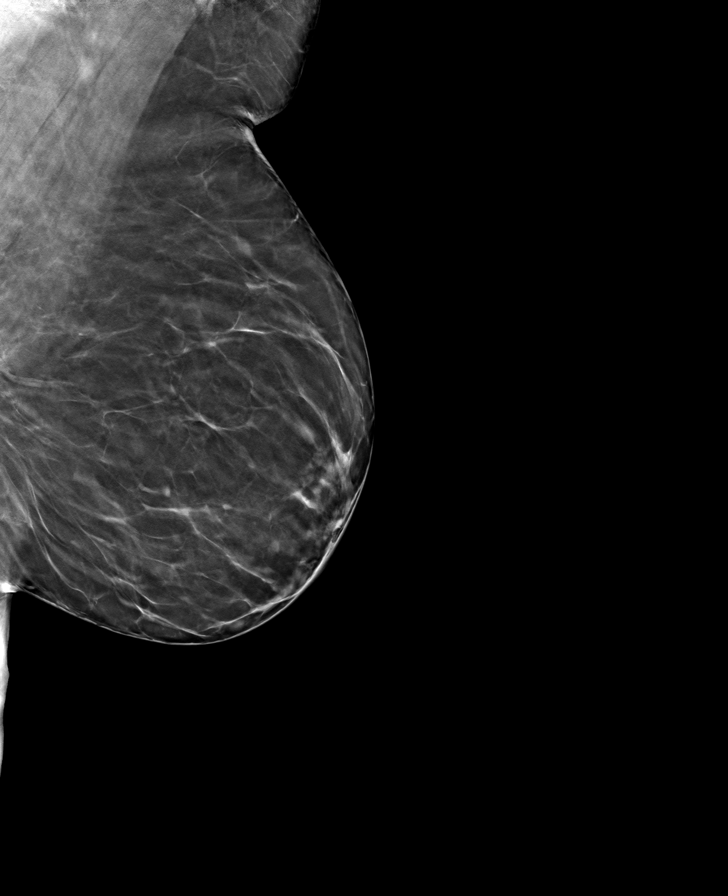

[R MLO tomo · tomo slice 35/68.0]
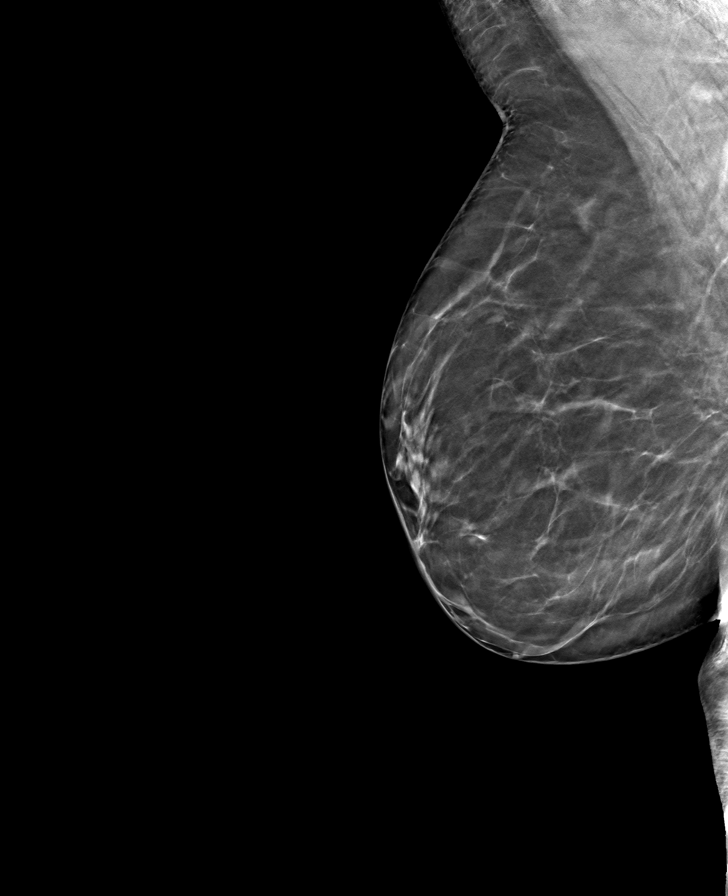

[8 of 24 positions shown; findings below may reference images not displayed]

ACR Breast Density Category b: There are scattered areas of
fibroglandular density.
FINDINGS: There are no findings suspicious for malignancy.
IMPRESSION: No mammographic evidence of malignancy. A result letter of this
screening mammogram will be mailed directly to the patient.

RECOMMENDATION:
Screening mammogram in one year. (Code:51-O-LD2)

BI-RADS CATEGORY  1: Negative.

## 2023-02-10 ENCOUNTER — Encounter: Payer: Self-pay | Admitting: Family

## 2023-02-10 MED ORDER — ESTRADIOL 0.5 MG PO TABS: 0.5000 mg | ORAL_TABLET | Freq: Every day | ORAL | 1 refills | Status: DC

## 2023-02-19 ENCOUNTER — Encounter (HOSPITAL_BASED_OUTPATIENT_CLINIC_OR_DEPARTMENT_OTHER): Payer: Self-pay | Admitting: Obstetrics and Gynecology

## 2023-02-19 NOTE — Progress Notes (Signed)
Spoke w/ via phone for pre-op interview--- pt Lab needs dos---- [employer], ekg              Lab results------ no COVID test -----patient states asymptomatic no test needed Arrive at ------- 1200 on 03-01-2023 NPO after MN NO Solid Food.  Clear liquids from MN until--- 1100 Med rec completed Medications to take morning of surgery ----- synthroid, estradiol Diabetic medication ----- n/a Patient instructed no nail polish to be worn day of surgery Patient instructed to bring photo id and insurance card day of surgery Patient aware to have Driver (ride ) / caregiver    for 24 hours after surgery -- friend, ruth Patient Special Instructions ----- n/a Pre-Op special Istructions ----- n/a Patient verbalized understanding of instructions that were given at this phone interview. Patient denies shortness of breath, chest pain, fever, cough at this phone interview.

## 2023-02-23 DIAGNOSIS — H43811 Vitreous degeneration, right eye: Secondary | ICD-10-CM | POA: Diagnosis not present

## 2023-03-01 ENCOUNTER — Other Ambulatory Visit: Payer: Self-pay

## 2023-03-01 ENCOUNTER — Ambulatory Visit (HOSPITAL_BASED_OUTPATIENT_CLINIC_OR_DEPARTMENT_OTHER)
Admission: RE | Admit: 2023-03-01 | Discharge: 2023-03-01 | Disposition: A | Discharge status: Home / Self Care | Payer: BC Managed Care – PPO | Attending: Obstetrics and Gynecology | Admitting: Obstetrics and Gynecology

## 2023-03-01 ENCOUNTER — Ambulatory Visit (HOSPITAL_BASED_OUTPATIENT_CLINIC_OR_DEPARTMENT_OTHER): Payer: BC Managed Care – PPO | Admitting: Anesthesiology

## 2023-03-01 ENCOUNTER — Encounter (HOSPITAL_BASED_OUTPATIENT_CLINIC_OR_DEPARTMENT_OTHER): Payer: Self-pay | Admitting: Obstetrics and Gynecology

## 2023-03-01 ENCOUNTER — Encounter (HOSPITAL_BASED_OUTPATIENT_CLINIC_OR_DEPARTMENT_OTHER)
Admission: RE | Disposition: A | Discharge status: Home / Self Care | Payer: Self-pay | Source: Home / Self Care | Attending: Obstetrics and Gynecology

## 2023-03-01 DIAGNOSIS — I10 Essential (primary) hypertension: Secondary | ICD-10-CM | POA: Insufficient documentation

## 2023-03-01 DIAGNOSIS — Z01818 Encounter for other preprocedural examination: Secondary | ICD-10-CM

## 2023-03-01 DIAGNOSIS — N393 Stress incontinence (female) (male): Secondary | ICD-10-CM | POA: Insufficient documentation

## 2023-03-01 DIAGNOSIS — E039 Hypothyroidism, unspecified: Secondary | ICD-10-CM | POA: Insufficient documentation

## 2023-03-01 DIAGNOSIS — Z79899 Other long term (current) drug therapy: Secondary | ICD-10-CM | POA: Diagnosis not present

## 2023-03-01 HISTORY — DX: Frequency of micturition: R35.0

## 2023-03-01 HISTORY — PX: BLADDER SUSPENSION: SHX72

## 2023-03-01 HISTORY — DX: Personal history of adenomatous and serrated colon polyps: Z86.0101

## 2023-03-01 HISTORY — DX: Personal history of other diseases of the respiratory system: Z87.09

## 2023-03-01 HISTORY — DX: Diverticulosis of large intestine without perforation or abscess without bleeding: K57.30

## 2023-03-01 HISTORY — DX: Stress incontinence (female) (male): N39.3

## 2023-03-01 HISTORY — DX: Personal history of colonic polyps: Z86.010

## 2023-03-01 HISTORY — PX: CYSTOSCOPY: SHX5120

## 2023-03-01 HISTORY — DX: Hypothyroidism, unspecified: E03.9

## 2023-03-01 LAB — POCT I-STAT, CHEM 8
BUN: 16 mg/dL (ref 6–20)
Calcium, Ion: 1.3 mmol/L (ref 1.15–1.40)
Chloride: 104 mmol/L (ref 98–111)
Creatinine, Ser: 0.7 mg/dL (ref 0.44–1.00)
Glucose, Bld: 89 mg/dL (ref 70–99)
HCT: 44 % (ref 36.0–46.0)
Hemoglobin: 15 g/dL (ref 12.0–15.0)
Potassium: 3.6 mmol/L (ref 3.5–5.1)
Sodium: 142 mmol/L (ref 135–145)
TCO2: 29 mmol/L (ref 22–32)

## 2023-03-01 SURGERY — URETHROPEXY, USING TRANSVAGINAL TAPE
Anesthesia: General | Site: Vagina

## 2023-03-01 MED ORDER — CLINDAMYCIN PHOSPHATE 900 MG/50ML IV SOLN
900.0000 mg | INTRAVENOUS | Status: AC
  Administered 2023-03-01: 900 mg via INTRAVENOUS

## 2023-03-01 MED ORDER — DEXAMETHASONE SODIUM PHOSPHATE 10 MG/ML IJ SOLN
INTRAMUSCULAR | Status: DC | PRN
  Administered 2023-03-01: 5 mg via INTRAVENOUS

## 2023-03-01 MED ORDER — ACETAMINOPHEN 500 MG PO TABS
ORAL_TABLET | ORAL | Status: AC
  Filled 2023-03-01: qty 2

## 2023-03-01 MED ORDER — ONDANSETRON HCL 4 MG/2ML IJ SOLN
INTRAMUSCULAR | Status: AC
  Filled 2023-03-01: qty 2

## 2023-03-01 MED ORDER — ONDANSETRON HCL 4 MG/2ML IJ SOLN
INTRAMUSCULAR | Status: DC | PRN
  Administered 2023-03-01: 4 mg via INTRAVENOUS

## 2023-03-01 MED ORDER — GABAPENTIN 300 MG PO CAPS
300.0000 mg | ORAL_CAPSULE | ORAL | Status: AC
  Administered 2023-03-01: 300 mg via ORAL

## 2023-03-01 MED ORDER — PHENAZOPYRIDINE HCL 100 MG PO TABS
ORAL_TABLET | ORAL | Status: AC
  Filled 2023-03-01: qty 2

## 2023-03-01 MED ORDER — ACETAMINOPHEN 500 MG PO TABS
1000.0000 mg | ORAL_TABLET | ORAL | Status: AC
  Administered 2023-03-01: 1000 mg via ORAL

## 2023-03-01 MED ORDER — GABAPENTIN 300 MG PO CAPS
ORAL_CAPSULE | ORAL | Status: AC
  Filled 2023-03-01: qty 1

## 2023-03-01 MED ORDER — SURGIFLO WITH THROMBIN (HEMOSTATIC MATRIX KIT) OPTIME
TOPICAL | Status: DC | PRN
  Administered 2023-03-01: 1

## 2023-03-01 MED ORDER — LACTATED RINGERS IV SOLN: INTRAVENOUS | Status: DC

## 2023-03-01 MED ORDER — MIDAZOLAM HCL 2 MG/2ML IJ SOLN
INTRAMUSCULAR | Status: AC
  Filled 2023-03-01: qty 2

## 2023-03-01 MED ORDER — LIDOCAINE-EPINEPHRINE 1 %-1:100000 IJ SOLN
INTRAMUSCULAR | Status: DC | PRN
  Administered 2023-03-01: 10 mL

## 2023-03-01 MED ORDER — PROPOFOL 10 MG/ML IV BOLUS
INTRAVENOUS | Status: DC | PRN
  Administered 2023-03-01: 120 mg via INTRAVENOUS

## 2023-03-01 MED ORDER — PROPOFOL 10 MG/ML IV BOLUS
INTRAVENOUS | Status: AC
  Filled 2023-03-01: qty 20

## 2023-03-01 MED ORDER — PHENAZOPYRIDINE HCL 100 MG PO TABS
200.0000 mg | ORAL_TABLET | ORAL | Status: AC
  Administered 2023-03-01: 200 mg via ORAL

## 2023-03-01 MED ORDER — SODIUM CHLORIDE 0.9 % IR SOLN
Status: DC | PRN
  Administered 2023-03-01: 1000 mL via INTRAVESICAL

## 2023-03-01 MED ORDER — GENTAMICIN SULFATE 40 MG/ML IJ SOLN
5.0000 mg/kg | INTRAVENOUS | Status: AC
  Administered 2023-03-01: 290 mg via INTRAVENOUS
  Filled 2023-03-01: qty 7.25

## 2023-03-01 MED ORDER — 0.9 % SODIUM CHLORIDE (POUR BTL) OPTIME
TOPICAL | Status: DC | PRN
  Administered 2023-03-01: 500 mL

## 2023-03-01 MED ORDER — MIDAZOLAM HCL 2 MG/2ML IJ SOLN
INTRAMUSCULAR | Status: DC | PRN
  Administered 2023-03-01: 2 mg via INTRAVENOUS

## 2023-03-01 MED ORDER — FENTANYL CITRATE (PF) 100 MCG/2ML IJ SOLN: 25.0000 ug | INTRAMUSCULAR | Status: DC | PRN

## 2023-03-01 MED ORDER — FENTANYL CITRATE (PF) 100 MCG/2ML IJ SOLN
INTRAMUSCULAR | Status: DC | PRN
  Administered 2023-03-01: 50 ug via INTRAVENOUS

## 2023-03-01 MED ORDER — FENTANYL CITRATE (PF) 100 MCG/2ML IJ SOLN
INTRAMUSCULAR | Status: AC
  Filled 2023-03-01: qty 2

## 2023-03-01 MED ORDER — DEXAMETHASONE SODIUM PHOSPHATE 10 MG/ML IJ SOLN
INTRAMUSCULAR | Status: AC
  Filled 2023-03-01: qty 1

## 2023-03-01 MED ORDER — LIDOCAINE 2% (20 MG/ML) 5 ML SYRINGE
INTRAMUSCULAR | Status: DC | PRN
  Administered 2023-03-01: 60 mg via INTRAVENOUS

## 2023-03-01 MED ORDER — CLINDAMYCIN PHOSPHATE 900 MG/50ML IV SOLN
INTRAVENOUS | Status: AC
  Filled 2023-03-01: qty 50

## 2023-03-01 SURGICAL SUPPLY — 36 items
ADH SKN CLS APL DERMABOND .7 (GAUZE/BANDAGES/DRESSINGS) ×2
AGENT HMST KT MTR STRL THRMB (HEMOSTASIS) ×2
BLADE CLIPPER SENSICLIP SURGIC (BLADE) ×3 IMPLANT
BLADE SURG 15 STRL LF DISP TIS (BLADE) ×3 IMPLANT
BLADE SURG 15 STRL SS (BLADE) ×2
DERMABOND ADVANCED .7 DNX12 (GAUZE/BANDAGES/DRESSINGS) ×3 IMPLANT
ELECT REM PT RETURN 9FT ADLT (ELECTROSURGICAL)
ELECTRODE REM PT RTRN 9FT ADLT (ELECTROSURGICAL) IMPLANT
GAUZE 4X4 16PLY ~~LOC~~+RFID DBL (SPONGE) IMPLANT
GLOVE BIOGEL PI IND STRL 6.5 (GLOVE) ×3 IMPLANT
GLOVE ECLIPSE 6.0 STRL STRAW (GLOVE) ×3 IMPLANT
GOWN STRL REUS W/TWL LRG LVL3 (GOWN DISPOSABLE) ×3 IMPLANT
HIBICLENS CHG 4% 4OZ BTL (MISCELLANEOUS) ×3 IMPLANT
HOLDER FOLEY CATH W/STRAP (MISCELLANEOUS) ×3 IMPLANT
KIT TURNOVER CYSTO (KITS) ×3 IMPLANT
MANIFOLD NEPTUNE II (INSTRUMENTS) ×3 IMPLANT
NDL HYPO 22X1.5 SAFETY MO (MISCELLANEOUS) ×3 IMPLANT
NEEDLE HYPO 22X1.5 SAFETY MO (MISCELLANEOUS) ×2 IMPLANT
NS IRRIG 1000ML POUR BTL (IV SOLUTION) ×3 IMPLANT
PACK CYSTO (CUSTOM PROCEDURE TRAY) ×3 IMPLANT
PACK VAGINAL WOMENS (CUSTOM PROCEDURE TRAY) ×3 IMPLANT
RETRACTOR LONE STAR DISPOSABLE (INSTRUMENTS) ×3 IMPLANT
RETRACTOR STAY HOOK 5MM (MISCELLANEOUS) ×3 IMPLANT
SET IRRIG Y TYPE TUR BLADDER L (SET/KITS/TRAYS/PACK) ×3 IMPLANT
SLEEVE SCD COMPRESS KNEE MED (STOCKING) ×3 IMPLANT
SPIKE FLUID TRANSFER (MISCELLANEOUS) ×3 IMPLANT
SUCTION FRAZIER HANDLE 10FR (MISCELLANEOUS) ×2
SUCTION TUBE FRAZIER 10FR DISP (MISCELLANEOUS) ×3 IMPLANT
SURGIFLO W/THROMBIN 8M KIT (HEMOSTASIS) IMPLANT
SUT ABS MONO DBL WITH NDL 48IN (SUTURE) IMPLANT
SUT VIC AB 2-0 SH 27 (SUTURE) ×2
SUT VIC AB 2-0 SH 27XBRD (SUTURE) ×3 IMPLANT
SYR BULB EAR ULCER 3OZ GRN STR (SYRINGE) ×3 IMPLANT
SYS SLING ADV FIT BLUE TRNSVAG (Sling) IMPLANT
TOWEL OR 17X24 6PK STRL BLUE (TOWEL DISPOSABLE) ×3 IMPLANT
TRAY FOLEY W/BAG SLVR 14FR LF (SET/KITS/TRAYS/PACK) ×3 IMPLANT

## 2023-03-01 NOTE — Op Note (Signed)
Operative Note  Preoperative Diagnosis: stress urinary incontinence  Postoperative Diagnosis: same  Procedures performed:  Midurethral sling (Advantage Fit), cystoscopy  Implants:  Implant Name Type Inv. Item Serial No. Manufacturer Lot No. LRB No. Used Action  SYS SLING ADV FIT BLUE TRNSVAG - [id number] Sling SYS SLING ADV FIT BLUE TRNSVAG  Norvel Richards [id number] N/A 1 Implanted    Attending Surgeon: Lanetta Inch, MD  Anesthesia: General LMA  Findings: On cystoscopy, normal bladder and urethra without injury, lesion or foreign body. Brisk bilateral ureteral efflux noted.    Specimens: none  Estimated blood loss: 50 mL  IV fluids: 800 mL  Urine output: see flowsheet  Complications: none  Procedure in Detail:  After informed consent was obtained, the patient was taken to the operating room where she was placed under anesthesia.  She was then placed in the dorsal lithotomy position with allen stirrups,  and prepped and draped in the usual sterile fashion.  A strap was placed across her upper abdomen on top of her gown so it was not in direct contact with her skin.  Care was taken to avoid hyperflexion or hyperextension of her upper and lower extremities. A foley catheter was placed.  A lonestar self-retraining retractor was placed with 4 stay hooks. The mid urethral area was located on the anterior vaginal wall.  Two Allis clamps were placed at the level of the midurethra. 1% lidocaine with epinephrine was injected into the vaginal mucosa. A vertical incision was made between the two clamps using a 15-blade scalpel.  Using sharp dissection, Metzenbaum scissors were used to make a periurethral tunnel from the vaginal incision towards the pubic rami bilaterally for the future sling tracts. The bladder was ensured to be empty. The trocar and attached sling were introduced into the right side of the periurethral vaginal incision, just inferior to the pubic symphysis on the right side.  The trocar was guided through the endopelvic fascia and directly vertically.  While hugging the cephalad surface of the pubic bone, the trocar was guided out through the abdomen 2 fingerbreadths lateral to midline at the level of the pubic symphysis on the ipsilateral side. The trocar was placed on the left side in a similar fashion.  A 70-degree cystoscope was introduced, and 360-degree inspection revealed no trauma or trocars in the bladder, with brisk bilateral ureteral efflux.  The bladder was drained and the cystoscope was removed.  The Foley catheter was reinserted.  The sling was brought to lie beneath the mid-urethra.  A needle driver was placed behind the sling to ensure no tension.   The plastic sheath was removed from the sling and the distal ends of the sling were trimmed just below the level of the skin incisions.  Tension-free positioning of the sling was confirmed. Vaginal inspection revealed no vaginotomy or sling perforations of the mucosa. Surgiflo was placed through the incision and pressure was held. Good hemostasis noted.  The vaginal mucosal edges were reapproximated using 2-0 Vicryl.  The vagina was copiously irrigated.  Hemostasis was again noted. Vaginal packing not placed. The suprapubic sling incisions were closed with Dermabond. The patient tolerated the procedure well.  She was awakened from anesthesia and transferred to the recovery room in stable condition. All needle and sponge counts were correct x 2.     Marguerita Beards, MD

## 2023-03-01 NOTE — Interval H&P Note (Signed)
History and Physical Interval Note:  03/01/2023 2:20 PM  Tara Hamilton  has presented today for surgery, with the diagnosis of Stress urinary incontinence.  The various methods of treatment have been discussed with the patient and family. After consideration of risks, benefits and other options for treatment, the patient has consented to  Procedure(s) with comments: TRANSVAGINAL TAPE (TVT) PROCEDURE (N/A)  CYSTOSCOPY (N/A) as a surgical intervention.  The patient's history has been reviewed, patient examined, no change in status, stable for surgery.  I have reviewed the patient's chart and labs.  Questions were answered to the patient's satisfaction.     Marguerita Beards

## 2023-03-01 NOTE — Anesthesia Postprocedure Evaluation (Signed)
Anesthesia Post Note  Patient: Tara Hamilton  Procedure(s) Performed: TRANSVAGINAL TAPE (TVT) PROCEDURE (Vagina ) CYSTOSCOPY (Bladder)     Patient location during evaluation: PACU Anesthesia Type: General Level of consciousness: awake and alert Pain management: pain level controlled Vital Signs Assessment: post-procedure vital signs reviewed and stable Respiratory status: spontaneous breathing, nonlabored ventilation, respiratory function stable and patient connected to nasal cannula oxygen Cardiovascular status: blood pressure returned to baseline and stable Postop Assessment: no apparent nausea or vomiting Anesthetic complications: no  No notable events documented.  Last Vitals:  Vitals:   03/01/23 1224 03/01/23 1525  BP: (!) 148/79 106/68  Pulse: 68 74  Resp: 16 12  Temp: 36.6 C   SpO2: 100% 100%    Last Pain:  Vitals:   03/01/23 1525  TempSrc:   PainSc: Asleep                 Gianny Killman S

## 2023-03-01 NOTE — Discharge Instructions (Signed)
POST OPERATIVE INSTRUCTIONS  General Instructions Recovery (not bed rest) will last approximately 6 weeks Walking is encouraged, but refrain from strenuous exercise/ housework/ heavy lifting for 2 weeks. No lifting >10lbs  Nothing in the vagina- NO intercourse, tampons or douching for 6 weeks Bathing:  Do not submerge in water (NO swimming, bath, hot tub, etc) until after your postop visit. You can shower starting the day after surgery.  No driving until you are not taking narcotic pain medicine and until your pain is well enough controlled that you can slam on the breaks or make sudden movements if needed.   Taking your medications Please take your acetaminophen and ibuprofen on a schedule for the first 48 hours. Take '600mg'$  ibuprofen, then take '500mg'$  acetaminophen 3 hours later, then continue to alternate ibuprofen and acetaminophen. That way you are taking each type of medication every 6 hours. Take the prescribed narcotic (oxycodone, tramadol, etc) as needed, with a maximum being every 4 hours.  Take a stool softener daily to keep your stools soft and preventing you from straining. If you have diarrhea, you decrease your stool softener. This is explained more below. We have prescribed you Miralax.  Reasons to Call the Nurse (see last page for phone numbers) Heavy Bleeding (changing your pad every 1-2 hours) Persistent nausea/vomiting Fever (100.4 degrees or more) Incision problems (pus or other fluid coming out, redness, warmth, increased pain)  Things to Expect After Surgery Mild to Moderate pain is normal during the first day or two after surgery. If prescribed, take Ibuprofen or Tylenol first and use the stronger medicine for "break-through" pain. You can overlap these medicines because they work differently.   Constipation   To Prevent Constipation:  Eat a well-balanced diet including protein, grains, fresh fruit and vegetables.  Drink plenty of fluids. Walk regularly.  Depending on  specific instructions from your physician: take Miralax daily and additionally you can add a stool softener (colace/ docusate) and fiber supplement. Continue as long as you're on pain medications.   To Treat Constipation:  If you do not have a bowel movement in 2 days after surgery, you can take 2 Tbs of Milk of Magnesia 1-2 times a day until you have a bowel movement. If diarrhea occurs, decrease the amount or stop the laxative. If no results with Milk of Magnesia, you can drink a bottle of magnesium citrate which you can purchase over the counter.  Fatigue:  This is a normal response to surgery and will improve with time.  Plan frequent rest periods throughout the day.  Gas Pain:  This is very common but can also be very painful! Drink warm liquids such as herbal teas, bouillon or soup. Walking will help you pass more gas.  Mylicon or Gas-X can be taken over the counter.  Leaking Urine:  Varying amounts of leakage may occur after surgery.  This should improve with time. Your bladder needs at least 3 months to recover from surgery. If you leak after surgery, be sure to mention this to your doctor at your post-op visit. If you were taking medications for overactive bladder prior to surgery, be sure to restart the medications immediately after surgery.  Incisions: If you have incisions on your abdomen, the skin glue will dissolve on its own over time. It is ok to gently rinse with soap and water over these incisions but do not scrub.  Catheter Approximately 50% of patients are unable to urinate after surgery and need to go home with a  catheter. This allows your bladder to rest so it can return to full function. If you go home with a catheter, the office will call to set up a voiding trial a few days after surgery. For most patients, by this visit, they are able to urinate on their own. Long term catheter use is rare.   Return to Work  As work demands and recovery times vary widely, it is hard to  predict when you will want to return to work. If you have a desk job with no strenuous physical activity, and if you would like to return sooner than generally recommended, discuss this with your provider or call our office.   Post op concerns  For non-emergent issues, please call the Urogynecology Nurse. Please leave a message and someone will contact you within one business day.  You can also send a message through MyChart.   AFTER HOURS (After 5:00 PM and on weekends):  For urgent matters that cannot wait until the next business day. Call our office [phone] and connect to the doctor on call.  Please reserve this for important issues.   **FOR ANY TRUE EMERGENCY ISSUES CALL 911 OR GO TO THE NEAREST EMERGENCY ROOM.** Please inform our office or the doctor on call of any emergency.     APPOINTMENTS: Call [phone]

## 2023-03-01 NOTE — Progress Notes (Signed)
Foley catheter placed after patient failed void challenge and MD approved. Pt tolerated procedure well.

## 2023-03-01 NOTE — Anesthesia Procedure Notes (Signed)
Procedure Name: LMA Insertion Date/Time: 03/01/2023 2:48 PM  Performed by: Francie Massing, CRNAPre-anesthesia Checklist: Patient identified, Emergency Drugs available, Suction available and Patient being monitored Patient Re-evaluated:Patient Re-evaluated prior to induction Oxygen Delivery Method: Circle system utilized Preoxygenation: Pre-oxygenation with 100% oxygen Induction Type: IV induction Ventilation: Mask ventilation without difficulty LMA: LMA inserted LMA Size: 4.0 Number of attempts: 1 Airway Equipment and Method: Bite block Placement Confirmation: positive ETCO2 Tube secured with: Tape Dental Injury: Teeth and Oropharynx as per pre-operative assessment

## 2023-03-01 NOTE — Transfer of Care (Signed)
Immediate Anesthesia Transfer of Care Note  Patient: BELLAANN RODER  Procedure(s) Performed: Procedure(s) (LRB): TRANSVAGINAL TAPE (TVT) PROCEDURE (N/A) CYSTOSCOPY (N/A)  Patient Location: PACU  Anesthesia Type: General  Level of Consciousness: awake, oriented, sedated and patient cooperative  Airway & Oxygen Therapy: Patient Spontanous Breathing and Patient connected to face mask oxygen  Post-op Assessment: Report given to PACU RN and Post -op Vital signs reviewed and stable  Post vital signs: Reviewed and stable  Complications: No apparent anesthesia complications Last Vitals:  Vitals Value Taken Time  BP 106/68 03/01/23 1525  Temp    Pulse 73 03/01/23 1527  Resp 12 03/01/23 1527  SpO2 99 % 03/01/23 1527  Vitals shown include unvalidated device data.  Last Pain:  Vitals:   03/01/23 1224  TempSrc: Oral  PainSc: 0-No pain      Patients Stated Pain Goal: 5 (03/01/23 1224)  Complications: No notable events documented.

## 2023-03-01 NOTE — Anesthesia Preprocedure Evaluation (Addendum)
Anesthesia Evaluation  Patient identified by MRN, date of birth, ID band Patient awake    Reviewed: Allergy & Precautions, H&P , NPO status , Patient's Chart, lab work & pertinent test results  Airway Mallampati: III  TM Distance: >3 FB Neck ROM: Full    Dental no notable dental hx. (+) Teeth Intact, Dental Advisory Given   Pulmonary asthma    Pulmonary exam normal breath sounds clear to auscultation       Cardiovascular hypertension, Pt. on medications  Rhythm:Regular Rate:Normal     Neuro/Psych negative neurological ROS  negative psych ROS   GI/Hepatic negative GI ROS, Neg liver ROS,,,  Endo/Other  Hypothyroidism    Renal/GU negative Renal ROS  negative genitourinary   Musculoskeletal   Abdominal   Peds  Hematology negative hematology ROS (+)   Anesthesia Other Findings   Reproductive/Obstetrics negative OB ROS                             Anesthesia Physical Anesthesia Plan  ASA: 2  Anesthesia Plan: General   Post-op Pain Management: Tylenol PO (pre-op)* and Toradol IV (intra-op)*   Induction: Intravenous  PONV Risk Score and Plan: 4 or greater and Ondansetron, Dexamethasone and Midazolam  Airway Management Planned: LMA and Oral ETT  Additional Equipment:   Intra-op Plan:   Post-operative Plan: Extubation in OR  Informed Consent: I have reviewed the patients History and Physical, chart, labs and discussed the procedure including the risks, benefits and alternatives for the proposed anesthesia with the patient or authorized representative who has indicated his/her understanding and acceptance.     Dental advisory given  Plan Discussed with: CRNA  Anesthesia Plan Comments:        Anesthesia Quick Evaluation

## 2023-03-02 ENCOUNTER — Telehealth: Payer: Self-pay | Admitting: Obstetrics and Gynecology

## 2023-03-02 NOTE — Telephone Encounter (Signed)
Pt called today.  Catheter very uncomfortable.  Would like removed before Thursday as advised.  Would like to discuss further.

## 2023-03-02 NOTE — Telephone Encounter (Signed)
Tara Hamilton underwent midurethral sling, cystoscopy on 03/02/23.   She failed her voiding trial.  was backfilled into the bladder Voided  PVR by bladder scan was .   She was discharged with a catheter. Please call her for a routine post op check and to schedule a voiding trial by Thursday or Friday this week. Thanks!  Marguerita Beards, MD

## 2023-03-03 ENCOUNTER — Encounter (HOSPITAL_BASED_OUTPATIENT_CLINIC_OR_DEPARTMENT_OTHER): Payer: Self-pay | Admitting: Obstetrics and Gynecology

## 2023-03-03 NOTE — Telephone Encounter (Signed)
Post- Op Call  Pamala Duffel underwent  midurethral sling, cystoscopy on 03/02/2023 with Dr Florian Buff. The patient reports that her pain is controlled. She is taking Tylenol and Ibuprofen. She reports minimal vaginal bleeding. She has had a bowel movement and is taking Miralax for a bowel regimen. She was discharged with a catheter and will return on 03/04/2023 for a voiding trial.   Salina April, CMA

## 2023-03-04 ENCOUNTER — Ambulatory Visit (INDEPENDENT_AMBULATORY_CARE_PROVIDER_SITE_OTHER): Payer: BC Managed Care – PPO

## 2023-03-04 DIAGNOSIS — Z9889 Other specified postprocedural states: Secondary | ICD-10-CM

## 2023-03-04 NOTE — Progress Notes (Signed)
Urogyn Nurse Voiding Trial Note  Tara Hamilton underwent TRANSVAGINAL TAPE (TVT) PROCEDURE and CYSTOSCOPY on 03/01/2023.  She presents today for a voiding trial .  Patient was identified with 2 indicators. of NS was instilled into the bladder via the catheter. The catheter was removed and patient was instructed to void into the urinary hat. She voided . She passed the voiding trial and a catheter was not replaced.   The patient received aftercare instructions and will follow up as scheduled.

## 2023-03-04 NOTE — Patient Instructions (Signed)
Please drink plenty of water and if unable to void by 2pm please contact the office.

## 2023-03-10 ENCOUNTER — Ambulatory Visit: Payer: BC Managed Care – PPO | Admitting: Obstetrics and Gynecology

## 2023-03-17 ENCOUNTER — Ambulatory Visit (INDEPENDENT_AMBULATORY_CARE_PROVIDER_SITE_OTHER): Payer: BC Managed Care – PPO | Admitting: Obstetrics and Gynecology

## 2023-03-17 ENCOUNTER — Encounter: Payer: Self-pay | Admitting: Obstetrics and Gynecology

## 2023-03-17 VITALS — BP 123/79 | HR 73

## 2023-03-17 DIAGNOSIS — Z9889 Other specified postprocedural states: Secondary | ICD-10-CM

## 2023-03-17 NOTE — Progress Notes (Signed)
[facility]  Date of Visit: 03/17/2023  History of Present Illness: Tara Hamilton is a 62 y.o. female scheduled today for a post-operative visit.   Surgery: s/p Midurethral sling (Advantage Fit), cystoscopy on 03/01/23  She did not pass her postoperative void trial. She returned on 4/18 and passed her voiding trial.  Postoperative course has been uncomplicated.   Today she reports she is emptying well but feels urine stream is a bit slower.   UTI? No  Pain? No  She has returned to her normal activity (except for postop restrictions) Stress incontinence: No  Urgency/frequency:  sometimes Urge incontinence:  occasional Voiding dysfunction: No  Bowel issues: No   Medications: She has a current medication list which includes the following prescription(s): acetaminophen, vitamin c, calcium carb-cholecalciferol, estradiol, fexofenadine, hydrochlorothiazide, ibuprofen, levothyroxine, multiple vitamin, oxycodone, and polyethylene glycol powder.   Allergies: Patient is allergic to penicillins.   Physical Exam: BP 123/79   Pulse 73    Suprapubic Incisions: healing well.  Pelvic Examination: Vagina: Incisions healing well. Sutures are present at incision line and there is not granulation tissue.  No visible or palpable mesh.  POP-Q: deferred  ---------------------------------------------------------  Assessment and Plan:  1. Post-operative state    - Healing well. Good outcome with SUI - Has occasional urge incontinence but not bothersome enough for treatment at this time.  - Can resume regular activity including exercise. Refrain from intercourse until next post op visit.  All questions answered.   Marguerita Beards, MD

## 2023-04-15 NOTE — Progress Notes (Signed)
[facility]  Date of Visit: 04/16/2023  History of Present Illness: Tara Hamilton is a 62 y.o. female scheduled today for a post-operative visit.   Surgery: s/p Midurethral sling (Advantage Fit), cystoscopy on 03/01/23  She did not pass her postoperative void trial. She returned on 4/18 and passed her voiding trial.  Postoperative course has been uncomplicated.   Today she reports she is emptying well but sometimes has leakage with urgency.   UTI? No  Pain? No  She has returned to her normal activity (except for postop restrictions) Stress incontinence: No  Urgency/frequency: No  Urge incontinence:  occasional Voiding dysfunction: No  Bowel issues: No   Medications: She has a current medication list which includes the following prescription(s): acetaminophen, vitamin c, calcium carb-cholecalciferol, estradiol, fexofenadine, hydrochlorothiazide, ibuprofen, levothyroxine, multiple vitamin, and polyethylene glycol powder.   Allergies: Patient is allergic to penicillins.   Physical Exam: BP 119/83   Pulse 72    Suprapubic Incisions: healing well.  Pelvic Examination: Vagina: Incisions healing well. Sutures are not present at incision line and there is not granulation tissue.  No visible or palpable mesh.  POP-Q: deferred  ---------------------------------------------------------  Assessment and Plan:  1. Post-operative state     - SUI is well controlled - Has occasional urge incontinence and wants to try home PT before doing anything formal.  - Can resume swimming, intercourse, and bath tub use as desired.   All questions answered.   Tara Dominion, NP

## 2023-04-16 ENCOUNTER — Encounter: Payer: Self-pay | Admitting: Obstetrics and Gynecology

## 2023-04-16 ENCOUNTER — Ambulatory Visit (INDEPENDENT_AMBULATORY_CARE_PROVIDER_SITE_OTHER): Payer: BC Managed Care – PPO | Admitting: Obstetrics and Gynecology

## 2023-04-16 VITALS — BP 119/83 | HR 72

## 2023-04-16 DIAGNOSIS — Z9889 Other specified postprocedural states: Secondary | ICD-10-CM

## 2023-04-16 NOTE — Patient Instructions (Addendum)
You are cleared for intercourse, swimming, and bathing in bathtub.   Use lubrication for intercourse  If you are still having overactive bladder symptoms, let us know so we can try pelvic floor PT or medication if needed.

## 2023-04-22 ENCOUNTER — Other Ambulatory Visit: Payer: Self-pay | Admitting: Family

## 2023-04-22 DIAGNOSIS — Z1231 Encounter for screening mammogram for malignant neoplasm of breast: Secondary | ICD-10-CM

## 2023-04-25 ENCOUNTER — Other Ambulatory Visit: Payer: Self-pay | Admitting: Family

## 2023-04-27 ENCOUNTER — Ambulatory Visit
Admission: RE | Admit: 2023-04-27 | Discharge: 2023-04-27 | Disposition: A | Discharge status: Home / Self Care | Payer: BC Managed Care – PPO | Source: Ambulatory Visit | Attending: Family | Admitting: Family

## 2023-04-27 DIAGNOSIS — Z1231 Encounter for screening mammogram for malignant neoplasm of breast: Secondary | ICD-10-CM

## 2023-05-02 ENCOUNTER — Telehealth: Payer: BC Managed Care – PPO | Admitting: Physician Assistant

## 2023-05-02 DIAGNOSIS — H00016 Hordeolum externum left eye, unspecified eyelid: Secondary | ICD-10-CM

## 2023-05-02 DIAGNOSIS — H1033 Unspecified acute conjunctivitis, bilateral: Secondary | ICD-10-CM | POA: Diagnosis not present

## 2023-05-03 MED ORDER — POLYMYXIN B-TRIMETHOPRIM 10000-0.1 UNIT/ML-% OP SOLN
OPHTHALMIC | 0 refills | Status: DC
Start: 2023-05-03 — End: 2023-12-19

## 2023-05-03 NOTE — Progress Notes (Signed)
E-Visit for [employer]   We are sorry that you are not feeling well.  Here is how we plan to help!  Based on what you have shared with me it looks like you have conjunctivitis.  Conjunctivitis is a common inflammatory or infectious condition of the eye that is often referred to as "pink eye".  In most cases it is contagious (viral or bacterial). However, not all conjunctivitis requires antibiotics (ex. Allergic).  We have made appropriate suggestions for you based upon your presentation.  I have prescribed Polytrim Ophthalmic drops 1-2 drops 4 times a day times 5 days. For the stye present, continue to apply warm compresses to the area for 10 minutes a few times daily. This can take a while but should shrink and resolve over time. If the stye itself is not resolving over next 1-2 weeks, please be seen in person. The conjunctivitis should already start improving substantially in the next 48 hours.   Pink eye can be highly contagious.  It is typically spread through direct contact with secretions, or contaminated objects or surfaces that one may have touched.  Strict handwashing is suggested with soap and water is urged.  If not available, use alcohol based had sanitizer.  Avoid unnecessary touching of the eye.  If you wear contact lenses, you will need to refrain from wearing them until you see no white discharge from the eye for at least 24 hours after being on medication.  You should see symptom improvement in 1-2 days after starting the medication regimen.  Call us if symptoms are not improved in 1-2 days.  Home Care: Wash your hands often! Do not wear your contacts until you complete your treatment plan. Avoid sharing towels, bed linen, personal items with a person who has pink eye. See attention for anyone in your home with similar symptoms.  Get Help Right Away If: Your symptoms do not improve. You develop blurred or loss of vision. Your symptoms worsen (increased discharge, pain or  redness)   Thank you for choosing an e-visit.  Your e-visit answers were reviewed by a board certified advanced clinical practitioner to complete your personal care plan. Depending upon the condition, your plan could have included both over the counter or prescription medications.  Please review your pharmacy choice. Make sure the pharmacy is open so you can pick up prescription now. If there is a problem, you may contact your provider through [employer] and have the prescription routed to another pharmacy.  Your safety is important to [place]. If you have drug allergies check your prescription carefully.   For the next 24 hours you can use MyChart to ask questions about today's visit, request a non-urgent call back, or ask for a work or school excuse. You will get an email in the next two days asking about your experience. I hope that your e-visit has been valuable and will speed your recovery.

## 2023-05-03 NOTE — Progress Notes (Signed)
I have spent 5 minutes in review of e-visit questionnaire, review and updating patient chart, medical decision making and response to patient.   Mattilynn Forrer Cody Darthula Desa, PA-C    

## 2023-05-03 NOTE — Progress Notes (Signed)
Message sent to patient requesting further input regarding current symptoms. Awaiting patient response.  

## 2023-05-08 DIAGNOSIS — H00015 Hordeolum externum left lower eyelid: Secondary | ICD-10-CM | POA: Diagnosis not present

## 2023-05-22 ENCOUNTER — Telehealth: Payer: BC Managed Care – PPO | Admitting: Nurse Practitioner

## 2023-05-22 DIAGNOSIS — J0100 Acute maxillary sinusitis, unspecified: Secondary | ICD-10-CM

## 2023-05-22 MED ORDER — DOXYCYCLINE HYCLATE 100 MG PO TABS: 100.0000 mg | ORAL_TABLET | Freq: Two times a day (BID) | ORAL | 0 refills | Status: DC

## 2023-05-22 NOTE — Progress Notes (Signed)

## 2023-06-07 ENCOUNTER — Telehealth: Payer: BC Managed Care – PPO | Admitting: Nurse Practitioner

## 2023-06-07 DIAGNOSIS — J329 Chronic sinusitis, unspecified: Secondary | ICD-10-CM

## 2023-06-07 MED ORDER — DOXYCYCLINE HYCLATE 100 MG PO TABS
100.0000 mg | ORAL_TABLET | Freq: Two times a day (BID) | ORAL | 0 refills | Status: AC
Start: 2023-06-07 — End: 2023-06-17

## 2023-06-07 MED ORDER — IPRATROPIUM BROMIDE 0.03 % NA SOLN: 2.0000 | Freq: Two times a day (BID) | NASAL | 12 refills | Status: DC

## 2023-06-07 NOTE — Progress Notes (Signed)
E-Visit for Sinus Problems  We are sorry that you are not feeling well.  Here is how we plan to help!  Based on what you have shared with me it looks like you have sinusitis.  Sinusitis is inflammation and infection in the sinus cavities of the head.  Based on your presentation I believe you most likely have Acute Bacterial Sinusitis.  This is an infection caused by bacteria and is treated with antibiotics. I have prescribed Doxycycline 100mg  by mouth twice a day for 10 days. We will also send in a new nasal spray for you to use in combination with the antibiotics.   Meds ordered this encounter  Medications   doxycycline (VIBRA-TABS) 100 MG tablet    Sig: Take 1 tablet (100 mg total) by mouth 2 (two) times daily for 10 days.    Dispense:  20 tablet    Refill:  0   ipratropium (ATROVENT) 0.03 % nasal spray    Sig: Place 2 sprays into both nostrils every 12 (twelve) hours.    Dispense:  30 mL    Refill:  12    You may use an oral decongestant such as Mucinex D or if you have glaucoma or high blood pressure use plain Mucinex. Saline nasal spray help and can safely be used as often as needed for congestion.  If you develop worsening sinus pain, fever or notice severe headache and vision changes, or if symptoms are not better after completion of antibiotic, please schedule an appointment with a health care provider.    Sinus infections are not as easily transmitted as other respiratory infection, however we still recommend that you avoid close contact with loved ones, especially the very young and elderly.  Remember to wash your hands thoroughly throughout the day as this is the number one way to prevent the spread of infection!  Home Care: Only take medications as instructed by your medical team. Complete the entire course of an antibiotic. Do not take these medications with alcohol. A steam or ultrasonic humidifier can help congestion.  You can place a towel over your head and breathe in the  steam from hot water coming from a faucet. Avoid close contacts especially the very young and the elderly. Cover your mouth when you cough or sneeze. Always remember to wash your hands.  Get Help Right Away If: You develop worsening fever or sinus pain. You develop a severe head ache or visual changes. Your symptoms persist after you have completed your treatment plan.  Make sure you Understand these instructions. Will watch your condition. Will get help right away if you are not doing well or get worse.  Thank you for choosing an e-visit.  Your e-visit answers were reviewed by a board certified advanced clinical practitioner to complete your personal care plan. Depending upon the condition, your plan could have included both over the counter or prescription medications.  Please review your pharmacy choice. Make sure the pharmacy is open so you can pick up prescription now. If there is a problem, you may contact your provider through [employer] and have the prescription routed to another pharmacy.  Your safety is important to [place]. If you have drug allergies check your prescription carefully.   For the next 24 hours you can use MyChart to ask questions about today's visit, request a non-urgent call back, or ask for a work or school excuse. You will get an email in the next two days asking about your experience. I hope  that your e-visit has been valuable and will speed your recovery.   I spent approximately 5 minutes reviewing the patient's history, current symptoms and coordinating their care today.

## 2023-07-20 ENCOUNTER — Other Ambulatory Visit: Payer: Self-pay | Admitting: Family

## 2023-08-01 ENCOUNTER — Other Ambulatory Visit: Payer: Self-pay | Admitting: Family

## 2023-09-02 ENCOUNTER — Other Ambulatory Visit: Payer: Self-pay | Admitting: Family

## 2023-09-16 ENCOUNTER — Telehealth: Payer: BC Managed Care – PPO | Admitting: Emergency Medicine

## 2023-09-16 DIAGNOSIS — R300 Dysuria: Secondary | ICD-10-CM | POA: Diagnosis not present

## 2023-09-16 MED ORDER — NITROFURANTOIN MONOHYD MACRO 100 MG PO CAPS: 100.0000 mg | ORAL_CAPSULE | Freq: Two times a day (BID) | ORAL | 0 refills | Status: DC

## 2023-09-16 NOTE — Progress Notes (Signed)
E-Visit for Urinary Problems ° °We are sorry that you are not feeling well.  Here is how we plan to help! ° °Based on what you shared with me it looks like you most likely have a simple urinary tract infection. ° °A UTI (Urinary Tract Infection) is a bacterial infection of the bladder. ° °Most cases of urinary tract infections are simple to treat but a key part of your care is to encourage you to drink plenty of fluids and watch your symptoms carefully. ° °I have prescribed MacroBid 100 mg twice a day for 5 days.  Your symptoms should gradually improve. Call us if the burning in your urine worsens, you develop worsening fever, back pain or pelvic pain or if your symptoms do not resolve after completing the antibiotic. ° °Urinary tract infections can be prevented by drinking plenty of water to keep your body hydrated.  Also be sure when you wipe, wipe from front to back and don't hold it in!  If possible, empty your bladder every 4 hours. ° °HOME CARE °Drink plenty of fluids °Compete the full course of the antibiotics even if the symptoms resolve °Remember, when you need to go…go. Holding in your urine can increase the likelihood of getting a UTI! °GET HELP RIGHT AWAY IF: °You cannot urinate °You get a high fever °Worsening back pain occurs °You see blood in your urine °You feel sick to your stomach or throw up °You feel like you are going to pass out ° °MAKE SURE YOU  °Understand these instructions. °Will watch your condition. °Will get help right away if you are not doing well or get worse. ° ° °Thank you for choosing an e-visit. ° °Your e-visit answers were reviewed by a board certified advanced clinical practitioner to complete your personal care plan. Depending upon the condition, your plan could have included both over the counter or prescription medications. ° °Please review your pharmacy choice. Make sure the pharmacy is open so you can pick up prescription now. If there is a problem, you may contact your  provider through MyChart messaging and have the prescription routed to another pharmacy.  Your safety is important to us. If you have drug allergies check your prescription carefully.  ° °For the next 24 hours you can use MyChart to ask questions about today's visit, request a non-urgent call back, or ask for a work or school excuse. °You will get an email in the next two days asking about your experience. I hope that your e-visit has been valuable and will speed your recovery. ° °Approximately 5 minutes was used in reviewing the patient's chart, questionnaire, prescribing medications, and documentation. ° °

## 2023-10-30 ENCOUNTER — Other Ambulatory Visit: Payer: Self-pay | Admitting: Family

## 2023-11-19 ENCOUNTER — Telehealth: Payer: BC Managed Care – PPO | Admitting: Nurse Practitioner

## 2023-11-19 DIAGNOSIS — R3989 Other symptoms and signs involving the genitourinary system: Secondary | ICD-10-CM | POA: Diagnosis not present

## 2023-11-19 MED ORDER — NITROFURANTOIN MONOHYD MACRO 100 MG PO CAPS
100.0000 mg | ORAL_CAPSULE | Freq: Two times a day (BID) | ORAL | 0 refills | Status: AC
Start: 2023-11-19 — End: 2023-11-24

## 2023-11-19 NOTE — Progress Notes (Signed)
E-Visit for Urinary Problems  We are sorry that you are not feeling well.  Here is how we plan to help!  Based on what you shared with me it looks like you most likely have a simple urinary tract infection.  A UTI (Urinary Tract Infection) is a bacterial infection of the bladder.  Most cases of urinary tract infections are simple to treat but a key part of your care is to encourage you to drink plenty of fluids and watch your symptoms carefully.  I have prescribed MacroBid 100 mg twice a day for 5 days.  Your symptoms should gradually improve. Call us if the burning in your urine worsens, you develop worsening fever, back pain or pelvic pain or if your symptoms do not resolve after completing the antibiotic.  Urinary tract infections can be prevented by drinking plenty of water to keep your body hydrated.  Also be sure when you wipe, wipe from front to back and don't hold it in!  If possible, empty your bladder every 4 hours.  HOME CARE Drink plenty of fluids Compete the full course of the antibiotics even if the symptoms resolve Remember, when you need to go.go. Holding in your urine can increase the likelihood of getting a UTI! GET HELP RIGHT AWAY IF: You cannot urinate You get a high fever Worsening back pain occurs You see blood in your urine You feel sick to your stomach or throw up You feel like you are going to pass out  MAKE SURE YOU  Understand these instructions. Will watch your condition. Will get help right away if you are not doing well or get worse.   Thank you for choosing an e-visit.  Your e-visit answers were reviewed by a board certified advanced clinical practitioner to complete your personal care plan. Depending upon the condition, your plan could have included both over the counter or prescription medications.  Please review your pharmacy choice. Make sure the pharmacy is open so you can pick up prescription now. If there is a problem, you may contact your  provider through MyChart messaging and have the prescription routed to another pharmacy.  Your safety is important to us. If you have drug allergies check your prescription carefully.   For the next 24 hours you can use MyChart to ask questions about today's visit, request a non-urgent call back, or ask for a work or school excuse. You will get an email in the next two days asking about your experience. I hope that your e-visit has been valuable and will speed your recovery.   Meds ordered this encounter  Medications   nitrofurantoin, macrocrystal-monohydrate, (MACROBID) 100 MG capsule    Sig: Take 1 capsule (100 mg total) by mouth 2 (two) times daily for 5 days.    Dispense:  10 capsule    Refill:  0     I spent approximately 5 minutes reviewing the patient's history, current symptoms and coordinating their care today.   

## 2023-11-23 ENCOUNTER — Emergency Department (HOSPITAL_BASED_OUTPATIENT_CLINIC_OR_DEPARTMENT_OTHER)
Admission: EM | Admit: 2023-11-23 | Discharge: 2023-11-23 | Disposition: A | Discharge status: Home / Self Care | Payer: BC Managed Care – PPO | Attending: Emergency Medicine | Admitting: Emergency Medicine

## 2023-11-23 ENCOUNTER — Emergency Department (HOSPITAL_BASED_OUTPATIENT_CLINIC_OR_DEPARTMENT_OTHER): Payer: BC Managed Care – PPO

## 2023-11-23 ENCOUNTER — Encounter (HOSPITAL_BASED_OUTPATIENT_CLINIC_OR_DEPARTMENT_OTHER): Payer: Self-pay | Admitting: Urology

## 2023-11-23 ENCOUNTER — Other Ambulatory Visit: Payer: Self-pay

## 2023-11-23 DIAGNOSIS — N132 Hydronephrosis with renal and ureteral calculous obstruction: Secondary | ICD-10-CM | POA: Diagnosis not present

## 2023-11-23 DIAGNOSIS — N200 Calculus of kidney: Secondary | ICD-10-CM | POA: Diagnosis not present

## 2023-11-23 DIAGNOSIS — Z90710 Acquired absence of both cervix and uterus: Secondary | ICD-10-CM | POA: Diagnosis not present

## 2023-11-23 DIAGNOSIS — N202 Calculus of kidney with calculus of ureter: Secondary | ICD-10-CM | POA: Diagnosis not present

## 2023-11-23 DIAGNOSIS — N2889 Other specified disorders of kidney and ureter: Secondary | ICD-10-CM | POA: Diagnosis not present

## 2023-11-23 DIAGNOSIS — R109 Unspecified abdominal pain: Secondary | ICD-10-CM | POA: Diagnosis not present

## 2023-11-23 LAB — BASIC METABOLIC PANEL
Anion gap: 12 (ref 5–15)
BUN: 25 mg/dL — ABNORMAL HIGH (ref 8–23)
CO2: 26 mmol/L (ref 22–32)
Calcium: 9.6 mg/dL (ref 8.9–10.3)
Chloride: 100 mmol/L (ref 98–111)
Creatinine, Ser: 1.08 mg/dL — ABNORMAL HIGH (ref 0.44–1.00)
GFR, Estimated: 58 mL/min — ABNORMAL LOW (ref >60)
Glucose, Bld: 100 mg/dL — ABNORMAL HIGH (ref 70–99)
Potassium: 3.4 mmol/L — ABNORMAL LOW (ref 3.5–5.1)
Sodium: 138 mmol/L (ref 135–145)

## 2023-11-23 LAB — URINALYSIS, ROUTINE W REFLEX MICROSCOPIC
Bilirubin Urine: NEGATIVE
Glucose, UA: NEGATIVE mg/dL
Ketones, ur: NEGATIVE mg/dL
Leukocytes,Ua: NEGATIVE
Nitrite: NEGATIVE
Protein, ur: 100 mg/dL — AB
Specific Gravity, Urine: 1.025 (ref 1.005–1.030)
pH: 6 (ref 5.0–8.0)

## 2023-11-23 LAB — CBC
HCT: 41.1 % (ref 36.0–46.0)
Hemoglobin: 13.9 g/dL (ref 12.0–15.0)
MCH: 31.5 pg (ref 26.0–34.0)
MCHC: 33.8 g/dL (ref 30.0–36.0)
MCV: 93.2 fL (ref 80.0–100.0)
Platelets: 374 10*3/uL (ref 150–400)
RBC: 4.41 MIL/uL (ref 3.87–5.11)
RDW: 11.9 % (ref 11.5–15.5)
WBC: 9.5 10*3/uL (ref 4.0–10.5)
nRBC: 0 % (ref 0.0–0.2)

## 2023-11-23 LAB — URINALYSIS, MICROSCOPIC (REFLEX)

## 2023-11-23 MED ORDER — TAMSULOSIN HCL 0.4 MG PO CAPS
0.4000 mg | ORAL_CAPSULE | Freq: Once | ORAL | Status: AC
  Administered 2023-11-23: 0.4 mg via ORAL
  Filled 2023-11-23: qty 1

## 2023-11-23 MED ORDER — ONDANSETRON 4 MG PO TBDP
4.0000 mg | ORAL_TABLET | Freq: Once | ORAL | Status: AC
  Administered 2023-11-23: 4 mg via ORAL
  Filled 2023-11-23: qty 1

## 2023-11-23 MED ORDER — TAMSULOSIN HCL 0.4 MG PO CAPS: 0.4000 mg | ORAL_CAPSULE | Freq: Every day | ORAL | 0 refills | Status: AC

## 2023-11-23 MED ORDER — OXYCODONE HCL 5 MG PO TABS: 5.0000 mg | ORAL_TABLET | Freq: Four times a day (QID) | ORAL | 0 refills | Status: DC | PRN

## 2023-11-23 MED ORDER — OXYCODONE HCL 5 MG PO TABS
5.0000 mg | ORAL_TABLET | Freq: Once | ORAL | Status: AC
  Administered 2023-11-23: 5 mg via ORAL
  Filled 2023-11-23: qty 1

## 2023-11-23 MED ORDER — ONDANSETRON 4 MG PO TBDP: 4.0000 mg | ORAL_TABLET | Freq: Once | ORAL | Status: DC

## 2023-11-23 MED ORDER — ONDANSETRON 4 MG PO TBDP: 4.0000 mg | ORAL_TABLET | Freq: Three times a day (TID) | ORAL | 0 refills | Status: DC | PRN

## 2023-11-23 NOTE — Discharge Instructions (Addendum)
 Overall you are diagnosed with a 3 mm kidney stone today.  This should pass on its own however if you develop fever, uncontrollable nausea vomiting pain as we discussed please return for evaluation.  Take Tylenol  and ibuprofen  for pain.  I prescribed you Zofran  for nausea and vomiting.  I have prescribed you a narcotic pain medicine called oxycodone  for breakthrough pain.  This medication is sedating so please be careful with its use.  I have also prescribed you Flomax  to help move the stone out as well.  Follow-up with urology.  Drink plenty of fluids.  Follow-up with primary care doctor to discuss need for ultrasound to evaluate what we suspect the renal cyst.

## 2023-11-23 NOTE — ED Triage Notes (Signed)
 Pt states left lower pelvic pain that started last Friday in left flank Is on antibiotic for UTI since Friday

## 2023-11-23 NOTE — ED Notes (Signed)
 Reviewed D/C information with the patient, pt verbalized understanding. No additional concerns at this time.

## 2023-11-23 NOTE — ED Provider Notes (Signed)
 [place] [facility] Provider Note   CSN: [id number] Arrival date & time: 11/23/23  1841     History  Chief Complaint  Patient presents with   Flank Pain    Tara Hamilton is a 63 y.o. female.  The history is provided by the patient.       Home Medications Prior to Admission medications   Medication Sig Start Date End Date Taking? Authorizing Provider  ondansetron  (ZOFRAN -ODT) 4 MG disintegrating tablet Take 1 tablet (4 mg total) by mouth every 8 (eight) hours as needed for nausea or vomiting. 11/23/23  Yes Kayah Hecker, DO  oxyCODONE  (ROXICODONE ) 5 MG immediate release tablet Take 1 tablet (5 mg total) by mouth every 6 (six) hours as needed for up to 10 doses. 11/23/23  Yes Wanya Bangura, DO  tamsulosin  (FLOMAX ) 0.4 MG CAPS capsule Take 1 capsule (0.4 mg total) by mouth daily for 7 days. 11/23/23 11/30/23 Yes Samrat Hayward, DO  acetaminophen  (TYLENOL ) 500 MG tablet Take 1 tablet (500 mg total) by mouth every 6 (six) hours as needed (pain). 02/05/23   Zuleta, Kaitlin G, NP  Ascorbic Acid (VITAMIN C) 1000 MG tablet Take 1,000 mg by mouth 2 (two) times daily.    [provider]  Calcium Carbonate-Vitamin D  (TGT CALCIUM DIETARY SUPPLEMENT PO) Take 1 tablet by mouth in the morning and at bedtime.    [provider]  doxycycline  (VIBRA -TABS) 100 MG tablet Take 1 tablet (100 mg total) by mouth 2 (two) times daily. 1 po bid 05/22/23   Gladis Mustard, FNP  estradiol  (ESTRACE ) 0.5 MG tablet Take 1 tablet by mouth once daily 10/30/23   O'Sullivan, [name], NP  fexofenadine (ALLEGRA) 180 MG tablet Take 180 mg by mouth at bedtime.    [provider]  hydrochlorothiazide  (MICROZIDE ) 12.5 MG capsule Take 1 capsule (12.5 mg total) by mouth daily. 09/02/23   O'Sullivan, [name], NP  ibuprofen  (ADVIL ) 600 MG tablet Take 1 tablet (600 mg total) by mouth every 6 (six) hours as needed. 02/05/23   Zuleta, Kaitlin G, NP  ipratropium  (ATROVENT ) 0.03 % nasal spray Place 2 sprays into both nostrils every 12 (twelve) hours. 06/07/23   Kennyth Domino, FNP  levothyroxine  (SYNTHROID ) 75 MCG tablet Take 1 tablet by mouth once daily 07/20/23   O'Sullivan, [name], NP  Multiple Vitamins-Minerals (MULTIVITAMIN ADULT PO) Take 1 tablet by mouth daily.    [provider]  nitrofurantoin , macrocrystal-monohydrate, (MACROBID ) 100 MG capsule Take 1 capsule (100 mg total) by mouth 2 (two) times daily for 5 days. 11/19/23 11/24/23  Kennyth Domino, FNP  polyethylene glycol powder (GLYCOLAX /MIRALAX ) 17 GM/SCOOP powder Take 17 g by mouth daily. Drink 17g (1 scoop) dissolved in water per day. 02/05/23   Zuleta, Kaitlin G, NP  trimethoprim -polymyxin b  (POLYTRIM ) ophthalmic solution Apply 1-2 drops into affected eye QID x 5 days. 05/03/23   Gladis Elsie BROCKS, PA-C      Allergies    Penicillins    Review of Systems   Review of Systems  Physical Exam Updated Vital Signs BP (!) 150/94 (BP Location: Left Arm)   Pulse 94   Temp 97.7 F (36.5 C)   Resp 18   Ht 5' 2 (1.575 m)   Wt 75.3 kg   SpO2 100%   BMI 30.36 kg/m  Physical Exam Vitals and nursing note reviewed.  Constitutional:      General: She is not in acute distress.    Appearance: She is well-developed. She  is not ill-appearing.  HENT:     Head: Normocephalic and atraumatic.     Nose: Nose normal.     Mouth/Throat:     Mouth: Mucous membranes are moist.  Eyes:     Extraocular Movements: Extraocular movements intact.     Conjunctiva/sclera: Conjunctivae normal.     Pupils: Pupils are equal, round, and reactive to light.  Cardiovascular:     Rate and Rhythm: Normal rate and regular rhythm.     Pulses: Normal pulses.     Heart sounds: Normal heart sounds. No murmur heard. Pulmonary:     Effort: Pulmonary effort is normal. No respiratory distress.     Breath sounds: Normal breath sounds.  Abdominal:     General: Abdomen is flat.     Palpations: Abdomen is soft.      Tenderness: There is no abdominal tenderness.  Musculoskeletal:        General: No swelling.     Cervical back: Normal range of motion and neck supple.  Skin:    General: Skin is warm and dry.     Capillary Refill: Capillary refill takes less than 2 seconds.  Neurological:     General: No focal deficit present.     Mental Status: She is alert and oriented to person, place, and time.     Cranial Nerves: No cranial nerve deficit.     Sensory: No sensory deficit.     Motor: No weakness.     Coordination: Coordination normal.  Psychiatric:        Mood and Affect: Mood normal.     ED Results / Procedures / Treatments   Labs (all labs ordered are listed, but only abnormal results are displayed) Labs Reviewed  URINALYSIS, ROUTINE W REFLEX MICROSCOPIC - Abnormal; Notable for the following components:      Result Value   APPearance HAZY (*)    Hgb urine dipstick TRACE (*)    Protein, ur 100 (*)    All other components within normal limits  BASIC METABOLIC PANEL - Abnormal; Notable for the following components:   Potassium 3.4 (*)    Glucose, Bld 100 (*)    BUN 25 (*)    Creatinine, Ser 1.08 (*)    GFR, Estimated 58 (*)    All other components within normal limits  URINALYSIS, MICROSCOPIC (REFLEX) - Abnormal; Notable for the following components:   Bacteria, UA MANY (*)    All other components within normal limits  CBC    EKG None  Radiology CT Renal Stone Study Result Date: 11/23/2023 CLINICAL DATA:  Abdominal/flank pain, stone suspected. EXAM: CT ABDOMEN AND PELVIS WITHOUT CONTRAST TECHNIQUE: Multidetector CT imaging of the abdomen and pelvis was performed following the standard protocol without IV contrast. RADIATION DOSE REDUCTION: This exam was performed according to the departmental dose-optimization program which includes automated exposure control, adjustment of the mA and/or kV according to patient size and/or use of iterative reconstruction technique. COMPARISON:  None  Available. FINDINGS: Lower chest: No acute abnormality. Hepatobiliary: Unremarkable liver. Normal gallbladder. No biliary dilation. Pancreas: Unremarkable. Spleen: Unremarkable. Adrenals/Urinary Tract: Normal adrenal glands. Nonobstructing calyceal stones in both kidneys. Mild hydroureteronephrosis upstream from a 3 mm stone at the left UVJ. No right hydronephrosis. Nondistended bladder. Two small exophytic renal masses extending from the superior pole of the left kidney measuring 1.4 and 1.5 cm, respectively. These demonstrate low-intermediate density (Hounsfield units 25). Stomach/Bowel: Normal caliber large and small bowel. No bowel wall thickening. The appendix is normal.Stomach is  within normal limits. Vascular/Lymphatic: No significant vascular findings are present. No enlarged abdominal or pelvic lymph nodes. Reproductive: Status post hysterectomy. No adnexal masses. Other: No free intraperitoneal fluid or air. Musculoskeletal: No acute fracture. IMPRESSION: 1. Mild left hydroureteronephrosis upstream from a 3 mm stone at the left UVJ. 2. Additional nonobstructing calyceal stones in both kidneys. 3. Two small exophytic renal masses extending from the superior pole of the left kidney measuring 1.4 and 1.5 cm, respectively. These demonstrate low-intermediate density and are favored to represent benign cysts, however are incompletely characterized. Nonemergent renal ultrasound is recommended for further evaluation. Electronically Signed   By: Norman Gatlin M.D.   On: 11/23/2023 19:38    Procedures Procedures    Medications Ordered in ED Medications  oxyCODONE  (Oxy IR/ROXICODONE ) immediate release tablet 5 mg (has no administration in time range)  ondansetron  (ZOFRAN -ODT) disintegrating tablet 4 mg (has no administration in time range)  tamsulosin  (FLOMAX ) capsule 0.4 mg (has no administration in time range)    ED Course/ Medical Decision Making/ A&P                                 Medical  Decision Making Amount and/or Complexity of Data Reviewed Labs: ordered. Radiology: ordered.  Risk Prescription drug management.   Nena VEAR Budge left flank pain for the last couple days.  She is put on antibiotics on an ED visit but did not actually see a provider.  She has a history of hypertension high cholesterol.  Overall differential diagnosis kidney stone versus UTI versus pyelonephritis.  She is very well-appearing otherwise.  No fever.  She has been taking Tylenol  and ibuprofen  with some help.  Feels like pain is mostly in the left lower abdomen.  CBC BMP CT stone study urinalysis obtained.  Patient given Roxicodone  Zofran .  Per my review and interpretation of labs it does not appear to be a urine infection.  Seems like a dirty catch but she has no nitrates or leukocytes.  Lab work is unremarkable.  No significant leukocytosis anemia or electrolyte abnormality.  CT scan shows 3 mm stone at the left UVJ.  Otherwise CT scans unremarkable.  She has likely renal cysts and is aware of getting outpatient renal ultrasound.  Overall she feels well.  Will prescribe Roxicodone  and Zofran  Flomax  and recommend Tylenol  and ibuprofen .  Understands return precautions including fever worsening pain nausea vomiting.  Understands follow-up with urology.  Discharged in good condition.  This chart was dictated using voice recognition software.  Despite best efforts to proofread,  errors can occur which can change the documentation meaning.         Final Clinical Impression(s) / ED Diagnoses Final diagnoses:  Kidney stone    Rx / DC Orders ED Discharge Orders          Ordered    tamsulosin  (FLOMAX ) 0.4 MG CAPS capsule  Daily        11/23/23 1953    oxyCODONE  (ROXICODONE ) 5 MG immediate release tablet  Every 6 hours PRN        11/23/23 1953    ondansetron  (ZOFRAN -ODT) 4 MG disintegrating tablet  Every 8 hours PRN        11/23/23 1953              Ruthe Cornet, DO 11/23/23 1957

## 2023-11-29 ENCOUNTER — Other Ambulatory Visit: Payer: Self-pay | Admitting: Family

## 2023-12-15 ENCOUNTER — Telehealth: Payer: Self-pay | Admitting: Family

## 2023-12-15 DIAGNOSIS — N2889 Other specified disorders of kidney and ureter: Secondary | ICD-10-CM

## 2023-12-15 NOTE — Telephone Encounter (Signed)
I was reviewing the CT that was done during her ER visit. There are some kidney cysts noted.  The radiologist would like for her to have a specific kidney ultrasound to have a closer look. I have placed that order.   Did she pass the stone from the ED? If not, I would recommend a follow up appointment please.

## 2023-12-16 ENCOUNTER — Encounter: Payer: Self-pay | Admitting: Family

## 2023-12-16 ENCOUNTER — Other Ambulatory Visit: Payer: Self-pay | Admitting: Family

## 2023-12-16 DIAGNOSIS — N2889 Other specified disorders of kidney and ureter: Secondary | ICD-10-CM

## 2023-12-16 NOTE — Telephone Encounter (Signed)
Called patient but no answer, left voice mail for patient to call back.  Ok to relay message and sign [place] order once patient is aware of this information.

## 2023-12-16 NOTE — Telephone Encounter (Signed)
Spoke to patient and she wishes to have [place] at [facility] on [address].  Order replaced.

## 2023-12-17 NOTE — Telephone Encounter (Signed)
 Called patient but no answer, left voice mail for patient to call back.

## 2023-12-20 ENCOUNTER — Encounter: Payer: Self-pay | Admitting: Family

## 2023-12-20 ENCOUNTER — Ambulatory Visit (INDEPENDENT_AMBULATORY_CARE_PROVIDER_SITE_OTHER): Payer: BC Managed Care – PPO | Admitting: Family

## 2023-12-20 VITALS — BP 128/89 | HR 84 | Temp 97.7°F | Resp 16 | Ht 61.5 in | Wt 176.0 lb

## 2023-12-20 DIAGNOSIS — Z0000 Encounter for general adult medical examination without abnormal findings: Secondary | ICD-10-CM | POA: Diagnosis not present

## 2023-12-20 DIAGNOSIS — E039 Hypothyroidism, unspecified: Secondary | ICD-10-CM

## 2023-12-20 DIAGNOSIS — I10 Essential (primary) hypertension: Secondary | ICD-10-CM

## 2023-12-20 DIAGNOSIS — R32 Unspecified urinary incontinence: Secondary | ICD-10-CM

## 2023-12-20 DIAGNOSIS — M8580 Other specified disorders of bone density and structure, unspecified site: Secondary | ICD-10-CM | POA: Diagnosis not present

## 2023-12-20 DIAGNOSIS — E785 Hyperlipidemia, unspecified: Secondary | ICD-10-CM | POA: Diagnosis not present

## 2023-12-20 DIAGNOSIS — E876 Hypokalemia: Secondary | ICD-10-CM | POA: Diagnosis not present

## 2023-12-20 DIAGNOSIS — J45909 Unspecified asthma, uncomplicated: Secondary | ICD-10-CM

## 2023-12-20 DIAGNOSIS — J309 Allergic rhinitis, unspecified: Secondary | ICD-10-CM

## 2023-12-20 DIAGNOSIS — N2889 Other specified disorders of kidney and ureter: Secondary | ICD-10-CM | POA: Insufficient documentation

## 2023-12-20 LAB — LIPID PANEL
Cholesterol: 217 mg/dL — ABNORMAL HIGH (ref 0–200)
HDL: 65.1 mg/dL (ref >39.00)
LDL Cholesterol: 131 mg/dL — ABNORMAL HIGH (ref 0–99)
NonHDL: 151.82
Total CHOL/HDL Ratio: 3
Triglycerides: 105 mg/dL (ref 0.0–149.0)
VLDL: 21 mg/dL (ref 0.0–40.0)

## 2023-12-20 LAB — BASIC METABOLIC PANEL
BUN: 19 mg/dL (ref 6–23)
CO2: 28 meq/L (ref 19–32)
Calcium: 9.2 mg/dL (ref 8.4–10.5)
Chloride: 102 meq/L (ref 96–112)
Creatinine, Ser: 0.83 mg/dL (ref 0.40–1.20)
GFR: 76.13 mL/min (ref >60.00)
Glucose, Bld: 98 mg/dL (ref 70–99)
Potassium: 4 meq/L (ref 3.5–5.1)
Sodium: 139 meq/L (ref 135–145)

## 2023-12-20 LAB — TSH: TSH: 2.88 u[IU]/mL (ref 0.35–5.50)

## 2023-12-20 MED ORDER — HYDROCHLOROTHIAZIDE 12.5 MG PO CAPS: 12.5000 mg | ORAL_CAPSULE | Freq: Every day | ORAL | 0 refills | Status: DC

## 2023-12-20 MED ORDER — ESTRADIOL 0.5 MG PO TABS: 0.5000 mg | ORAL_TABLET | Freq: Every day | ORAL | 0 refills | Status: DC

## 2023-12-20 NOTE — Assessment & Plan Note (Signed)
 Stable

## 2023-12-20 NOTE — Assessment & Plan Note (Signed)
Managed with hydrochlorothiazide 12.5mg . Continue same.

## 2023-12-20 NOTE — Progress Notes (Signed)
Subjective:     Patient ID: Tara Hamilton, female    DOB: 12-01-1960, 63 y.o.   MRN: [id number]  Chief Complaint  Patient presents with   Annual Exam    HPI  Discussed the use of AI scribe software for clinical note transcription with the patient, who gave verbal consent to proceed.  History of Present Illness   The patient presents for an annual physical exam.  She recently experienced a kidney stone, which she passed after visiting the emergency room. She is scheduled for a follow up US to further evaluate a cyst noted on her kidney.   She is currently taking Synthroid 75 mcg and feels well on this dose. She also takes estradiol and is not ready to decrease the dose.   She has a history of osteopenia, with the last bone density scan conducted in 2020. She had a bladder lift surgery last year, which she feels has improved her ability to exercise without worry. She wonders if the bladder issue was related to the kidney stone, as her bladder was noted to be tilted.  She takes Allegra daily for allergies, which helps manage her symptoms, though she occasionally sneezes. She is considering taking a break from Allegra to avoid getting too used to it. No recent asthma symptoms, cough, cold symptoms, skin issues, hearing or vision concerns, leg swelling, constipation, diarrhea, burning or frequency with urination, unusual muscle or joint pain, frequent headaches, or depression and anxiety, except for concerns about her weight.  She is physically active, walking and doing Zumba, and has been achieving 10,000 steps daily for the past couple of weeks. She is up to date with her colonoscopy, mammogram, vision, and dental check-ups. She had an eye exam on the same day as her ER visit.      Immunizations: declines flu shot, declines covid booster Diet:  has been getting 46962 steps a day Wt Readings from Last 3 Encounters:  12/20/23 176 lb (79.8 kg)  11/23/23 166 lb 0.1 oz (75.3 kg)   03/01/23 166 lb (75.3 kg)  Exercise:  walking Colonoscopy: completed 3/24 Pap Smear: hysterectomy Mammogram: 6/24 Vision: up to date Dental: up to date      Health Maintenance Due  Topic Date Due   Pneumococcal Vaccine 27-39 Years old (2 of 2 - PCV) 12/13/2021   COVID-19 Vaccine (3 - 2024-25 season) 07/18/2023    Past Medical History:  Diagnosis Date   Diverticulosis of colon    Frequency of urination    History of adenomatous polyp of colon    History of asthma    as a child   Hyperlipidemia    borderline   Hypertension    Hypothyroidism    followed by pcp   Osteopenia    SUI (stress urinary incontinence, female)     Past Surgical History:  Procedure Laterality Date   BLADDER SUSPENSION N/A 03/01/2023   Procedure: TRANSVAGINAL TAPE (TVT) PROCEDURE;  Surgeon: Marguerita Beards, MD;  Location: [facility];  Service: Gynecology;  Laterality: N/A;  Total time requested is 1 hour   COLONOSCOPY WITH PROPOFOL  01/25/2023   dr Lavon Paganini   CYSTOSCOPY N/A 03/01/2023   Procedure: CYSTOSCOPY;  Surgeon: Marguerita Beards, MD;  Location: [facility];  Service: Gynecology;  Laterality: N/A;   LAPAROSCOPY     age 58, ovarian cyst and endometriosis excision with Dr. Jennette Kettle in [place]   TONSILLECTOMY  1970   TOTAL ABDOMINAL HYSTERECTOMY W/ BILATERAL SALPINGOOPHORECTOMY  1999    Family History  Problem Relation Age of Onset   Hypertension Mother    Stroke Mother 65   Peripheral vascular disease Mother    Hypertension Father        died from suicide   Diabetes Sister    Diabetes Brother    Cancer Maternal Aunt 73       uterine   COPD Maternal Grandmother    COPD Maternal Grandfather    Heart disease Paternal Grandmother    Thyroid disease Neg Hx    Colon cancer Neg Hx    Colon polyps Neg Hx    Crohn's disease Neg Hx    Esophageal cancer Neg Hx    Rectal cancer Neg Hx    Stomach cancer Neg Hx    Ulcerative colitis Neg Hx     Social  History   Socioeconomic History   Marital status: Divorced    Spouse name: Not on file   Number of children: Not on file   Years of education: Not on file   Highest education level: Some college, no degree  Occupational History   Not on file  Tobacco Use   Smoking status: Never   Smokeless tobacco: Never  Vaping Use   Vaping status: Never Used  Substance and Sexual Activity   Alcohol use: Not Currently    Comment: seldom   Drug use: Never   Sexual activity: Not on file  Other Topics Concern   Not on file  Social History Narrative   From here, recently moved back from 20 years in [place].    Works as a IT consultant   2 grown sons one in [place], other son lives in [place]   Single    One grandchild in [place] Radio broadcast assistant)   Enjoys Nutritional therapist, church, reading   Social Drivers of Health   Financial Resource Strain: Low Risk  (12/15/2023)   Overall Financial Resource Strain (CARDIA)    Difficulty of Paying Living Expenses: Not very hard  Food Insecurity: No Food Insecurity (12/15/2023)   Hunger Vital Sign    Worried About Running Out of Food in the Last Year: Never true    Ran Out of Food in the Last Year: Never true  Transportation Needs: No Transportation Needs (12/15/2023)   PRAPARE - Administrator, Civil Service (Medical): No    Lack of Transportation (Non-Medical): No  Physical Activity: Insufficiently Active (12/15/2023)   Exercise Vital Sign    Days of Exercise per Week: 4 days    Minutes of Exercise per Session: 30 min  Stress: No Stress Concern Present (12/15/2023)   [employer] of Occupational Health - Occupational Stress Questionnaire    Feeling of Stress : Not at all  Social Connections: Moderately Integrated (12/15/2023)   Social Connection and Isolation Panel [NHANES]    Frequency of Communication with Friends and Family: More than three times a week    Frequency of Social Gatherings with Friends and Family: Twice a week    Attends  Religious Services: More than 4 times per year    Active Member of [employer] or Organizations: Yes    Attends Engineer, structural: More than 4 times per year    Marital Status: Divorced  Catering manager Violence: Not on file    Outpatient Medications Prior to Visit  Medication Sig Dispense Refill   Ascorbic Acid (VITAMIN C) 1000 MG tablet Take 1,000 mg by mouth 2 (two) times daily.  Calcium Carbonate-Vitamin D (TGT CALCIUM DIETARY SUPPLEMENT PO) Take 1 tablet by mouth in the morning and at bedtime.     fexofenadine (ALLEGRA) 180 MG tablet Take 180 mg by mouth at bedtime.     ipratropium (ATROVENT) 0.03 % nasal spray Place 2 sprays into both nostrils every 12 (twelve) hours. 30 mL 12   levothyroxine (SYNTHROID) 75 MCG tablet Take 1 tablet by mouth once daily 90 tablet 1   Multiple Vitamins-Minerals (MULTIVITAMIN ADULT PO) Take 1 tablet by mouth daily.     estradiol (ESTRACE) 0.5 MG tablet Take 1 tablet by mouth once daily 90 tablet 0   hydrochlorothiazide (MICROZIDE) 12.5 MG capsule Take 1 capsule by mouth once daily 90 capsule 0   ondansetron (ZOFRAN-ODT) 4 MG disintegrating tablet Take 1 tablet (4 mg total) by mouth every 8 (eight) hours as needed for nausea or vomiting. 20 tablet 0   oxyCODONE (ROXICODONE) 5 MG immediate release tablet Take 1 tablet (5 mg total) by mouth every 6 (six) hours as needed for up to 10 doses. 10 tablet 0   acetaminophen (TYLENOL) 500 MG tablet Take 1 tablet (500 mg total) by mouth every 6 (six) hours as needed (pain). 30 tablet 0   doxycycline (VIBRA-TABS) 100 MG tablet Take 1 tablet (100 mg total) by mouth 2 (two) times daily. 1 po bid 20 tablet 0   ibuprofen (ADVIL) 600 MG tablet Take 1 tablet (600 mg total) by mouth every 6 (six) hours as needed. 30 tablet 0   polyethylene glycol powder (GLYCOLAX/MIRALAX) 17 GM/SCOOP powder Take 17 g by mouth daily. Drink 17g (1 scoop) dissolved in water per day. 255 g 0   trimethoprim-polymyxin b (POLYTRIM)  ophthalmic solution Apply 1-2 drops into affected eye QID x 5 days. 10 mL 0   No facility-administered medications prior to visit.    Allergies  Allergen Reactions   Penicillins Swelling    Review of Systems  Constitutional:  Negative for weight loss.  HENT:  Negative for congestion and hearing loss.   Eyes:  Negative for blurred vision.  Respiratory:  Negative for cough.   Cardiovascular:  Negative for leg swelling.  Gastrointestinal:  Negative for constipation and diarrhea.  Genitourinary:  Negative for dysuria and frequency.  Musculoskeletal:  Negative for joint pain and myalgias.  Skin:  Negative for rash.  Neurological:  Negative for headaches.  Psychiatric/Behavioral:         Denies depression/anxiety       Objective:    Physical Exam   BP 128/89 (BP Location: Right Arm, Patient Position: Sitting, Cuff Size: Normal)   Pulse 84   Temp 97.7 F (36.5 C) (Oral)   Resp 16   Ht 5' 1.5" (1.562 m)   Wt 176 lb (79.8 kg)   SpO2 100%   BMI 32.72 kg/m  Wt Readings from Last 3 Encounters:  12/20/23 176 lb (79.8 kg)  11/23/23 166 lb 0.1 oz (75.3 kg)  03/01/23 166 lb (75.3 kg)   Physical Exam  Constitutional: She is oriented to person, place, and time. She appears well-developed and well-nourished. No distress.  HENT:  Head: Normocephalic and atraumatic.  Right Ear: Tympanic membrane and ear canal normal.  Left Ear: Tympanic membrane and ear canal normal.  Mouth/Throat: Oropharynx is clear and moist.  Eyes: Pupils are equal, round, and reactive to light. No scleral icterus.  Neck: Normal range of motion. No thyromegaly present.  Cardiovascular: Normal rate and regular rhythm.   No murmur heard. Pulmonary/Chest: Effort normal  and breath sounds normal. No respiratory distress. He has no wheezes. She has no rales. She exhibits no tenderness.  Abdominal: Soft. Bowel sounds are normal. She exhibits no distension and no mass. There is no tenderness. There is no rebound and  no guarding.  Musculoskeletal: She exhibits no edema.  Lymphadenopathy:    She has no cervical adenopathy.  Neurological: She is alert and oriented to person, place, and time. She has normal patellar reflexes. She exhibits normal muscle tone. Coordination normal.  Skin: Skin is warm and dry.  Psychiatric: She has a normal mood and affect. Her behavior is normal. Judgment and thought content normal.  Breast/Pelvic: deferred     Assessment & Plan:       Assessment & Plan:   Problem List Items Addressed This Visit       Unprioritized   Urinary incontinence   Resolved following bladder suspension surgery.  She is pleased with the results.       Preventative health care - Primary    -Continue healthy diet and exercise regimen. -Declines flu shot.  -Continue Synthroid 75mg  for thyroid management. -Refill Hydrochlorothiazide and Estradiol prescriptions. -Complete labs for kidney function, cholesterol, and thyroid. -Schedule annual physical for 2026.      Osteopenia   Relevant Orders   DG Bone Density   Left renal mass   2 masses left kidney noted on CT, await [place] results for further characterization.      Hypothyroid   Clinically stable on synthroid 75 mcg.  Update TSH      Relevant Orders   TSH   HTN (hypertension)   Managed with hydrochlorothiazide 12.5mg . Continue same.       Relevant Medications   hydrochlorothiazide (MICROZIDE) 12.5 MG capsule   Asthma   Stable.       Allergic rhinitis   Stable with daily allegra.      Other Visit Diagnoses       Hypokalemia       Relevant Orders   Basic Metabolic Panel (BMET)     Hyperlipidemia, unspecified hyperlipidemia type       Relevant Medications   hydrochlorothiazide (MICROZIDE) 12.5 MG capsule   Other Relevant Orders   Lipid panel       I have discontinued Pamala Duffel "Sandi"'s acetaminophen, ibuprofen, polyethylene glycol powder, trimethoprim-polymyxin b, doxycycline, oxyCODONE, and  ondansetron. I have also changed her hydrochlorothiazide and estradiol. Additionally, I am having her maintain her Multiple Vitamins-Minerals (MULTIVITAMIN ADULT PO), Calcium Carbonate-Vitamin D (TGT CALCIUM DIETARY SUPPLEMENT PO), fexofenadine, vitamin C, ipratropium, and levothyroxine.  Meds ordered this encounter  Medications   hydrochlorothiazide (MICROZIDE) 12.5 MG capsule    Sig: Take 1 capsule (12.5 mg total) by mouth daily.    Dispense:  90 capsule    Refill:  0    Supervising Provider:   Danise Edge A [[id number]]   estradiol (ESTRACE) 0.5 MG tablet    Sig: Take 1 tablet (0.5 mg total) by mouth daily.    Dispense:  90 tablet    Refill:  0    Supervising Provider:   Danise Edge A [[id number]]

## 2023-12-20 NOTE — Telephone Encounter (Signed)
Addressed in a different note.

## 2023-12-20 NOTE — Assessment & Plan Note (Signed)
2 masses left kidney noted on CT, await [place] results for further characterization.

## 2023-12-20 NOTE — Patient Instructions (Signed)
VISIT SUMMARY:  You had your annual physical exam today. We discussed your recent kidney stone, current medications, hormone replacement therapy, and overall health. You are doing well with your current medications and have been active with your exercise routine. We also reviewed your history of osteopenia and planned for future evaluations.  YOUR PLAN:  -RENAL CYSTS: Renal cysts are fluid-filled sacs in the kidneys, often found incidentally. You will have an ultrasound on 12/22/2023 to evaluate these cysts further. We will review the results and discuss them with you.  -MENOPAUSAL SYMPTOMS: Menopausal symptoms are managed with hormone replacement therapy. You will continue taking estradiol at your current dose. We discussed the risks of long-term hormone therapy, including an increased risk of stroke with age, and will consider dose reduction in the future.  -OSTEOPENIA: Osteopenia is a condition where bone density is lower than normal, which can lead to fractures. Your last bone density scan was in 2020, and we have ordered a new scan for 2025 at [facility].  -GENERAL HEALTH MAINTENANCE: Continue your healthy diet and exercise routine. Keep taking Synthroid 75mg  for thyroid management. Your Hydrochlorothiazide and Estradiol prescriptions have been refilled. Complete labs for kidney function, cholesterol, and thyroid. Schedule your next annual physical for 2026.  INSTRUCTIONS:  Please complete the ultrasound on 12/22/2023 and the bone density scan at [facility] in 2025. Also, complete the labs for kidney function, cholesterol, and thyroid as discussed. Schedule your next annual physical for 2026.

## 2023-12-20 NOTE — Assessment & Plan Note (Signed)
-  Continue healthy diet and exercise regimen. -Declines flu shot.  -Continue Synthroid 75mg  for thyroid management. -Refill Hydrochlorothiazide and Estradiol prescriptions. -Complete labs for kidney function, cholesterol, and thyroid. -Schedule annual physical for 2026.

## 2023-12-20 NOTE — Assessment & Plan Note (Signed)
Stable with daily allegra.

## 2023-12-20 NOTE — Assessment & Plan Note (Signed)
Resolved following bladder suspension surgery.  She is pleased with the results.

## 2023-12-20 NOTE — Assessment & Plan Note (Signed)
Clinically stable on synthroid 75 mcg.  Update TSH

## 2023-12-22 ENCOUNTER — Ambulatory Visit
Admission: RE | Admit: 2023-12-22 | Discharge: 2023-12-22 | Discharge status: Home / Self Care | Payer: BC Managed Care – PPO | Source: Ambulatory Visit | Attending: Family | Admitting: Family

## 2023-12-22 DIAGNOSIS — N281 Cyst of kidney, acquired: Secondary | ICD-10-CM | POA: Diagnosis not present

## 2023-12-22 DIAGNOSIS — N2889 Other specified disorders of kidney and ureter: Secondary | ICD-10-CM

## 2023-12-24 ENCOUNTER — Telehealth: Payer: Self-pay | Admitting: Family

## 2023-12-24 DIAGNOSIS — N281 Cyst of kidney, acquired: Secondary | ICD-10-CM | POA: Insufficient documentation

## 2023-12-24 NOTE — Telephone Encounter (Signed)
 Left message on voicemail advising pt to check her mychart message.

## 2024-01-09 NOTE — Progress Notes (Signed)
 Chief Complaint: No chief complaint on file.   History of Present Illness:  Tara Hamilton is a 63 y.o. female who is seen in consultation from Sandford Craze, NP for evaluation of left renal cysts.  She presented to the emergency room on November 23, 2023 with left flank pain.  She had prior treatment for UTI.  She had CT stone protocol showing 2 small cysts, incompletely characterized.  Renal ultrasound was recommended.  CT reading: 1. Mild left hydroureteronephrosis upstream from a 3 mm stone at the left UVJ. 2. Additional nonobstructing calyceal stones in both kidneys. 3. Two small exophytic renal masses extending from the superior pole of the left kidney measuring 1.4 and 1.5 cm, respectively. These demonstrate low-intermediate density and are favored to represent benign cysts, however are incompletely characterized. Nonemergent renal ultrasound is recommended for further evaluation  Renal ultrasound reading: The urinary bladder demonstrates normal anechoic echogenicity. The bilateral ureteral jets are visualized.   The right kidney measures 11.1 x 5.3 x 4.8 cm. Renal cortical echotexture is normal. There is no hydronephrosis. There are no stones. There are no cysts.   The left kidney measures 9.8 x 5.4 x 5.0 cm. Renal cortical echotexture is normal. There is no hydronephrosis. There are no stones. There is a complex cyst measuring 1.2 cm arising from the superior pole.   IMPRESSION: 1. Small complex cysts arising from the superior pole of the left kidney. No hydronephrosis bilaterally.  Past Medical History:  Past Medical History:  Diagnosis Date   Diverticulosis of colon    Frequency of urination    History of adenomatous polyp of colon    History of asthma    as a child   Hyperlipidemia    borderline   Hypertension    Hypothyroidism    followed by pcp   Osteopenia    SUI (stress urinary incontinence, female)     Past Surgical History:  Past  Surgical History:  Procedure Laterality Date   BLADDER SUSPENSION N/A 03/01/2023   Procedure: TRANSVAGINAL TAPE (TVT) PROCEDURE;  Surgeon: Marguerita Beards, MD;  Location: [facility];  Service: Gynecology;  Laterality: N/A;  Total time requested is 1 hour   COLONOSCOPY WITH PROPOFOL  01/25/2023   dr Lavon Paganini   CYSTOSCOPY N/A 03/01/2023   Procedure: CYSTOSCOPY;  Surgeon: Marguerita Beards, MD;  Location: [facility];  Service: Gynecology;  Laterality: N/A;   LAPAROSCOPY     age 75, ovarian cyst and endometriosis excision with Dr. Jennette Kettle in [place]   TONSILLECTOMY  1970   TOTAL ABDOMINAL HYSTERECTOMY W/ BILATERAL SALPINGOOPHORECTOMY  1999    Allergies:  Allergies  Allergen Reactions   Penicillins Swelling    Family History:  Family History  Problem Relation Age of Onset   Hypertension Mother    Stroke Mother 8   Peripheral vascular disease Mother    Hypertension Father        died from suicide   Diabetes Sister    Diabetes Brother    Cancer Maternal Aunt 24       uterine   COPD Maternal Grandmother    COPD Maternal Grandfather    Heart disease Paternal Grandmother    Thyroid disease Neg Hx    Colon cancer Neg Hx    Colon polyps Neg Hx    Crohn's disease Neg Hx    Esophageal cancer Neg Hx    Rectal cancer Neg Hx    Stomach cancer Neg Hx  Ulcerative colitis Neg Hx     Social History:  Social History   Tobacco Use   Smoking status: Never   Smokeless tobacco: Never  Vaping Use   Vaping status: Never Used  Substance Use Topics   Alcohol use: Not Currently    Comment: seldom   Drug use: Never    Review of symptoms:  Constitutional:  Negative for unexplained weight loss, night sweats, fever, chills ENT:  Negative for nose bleeds, sinus pain, painful swallowing CV:  Negative for chest pain, shortness of breath, exercise intolerance, palpitations, loss of consciousness Resp:  Negative for cough, wheezing, shortness of breath GI:   Negative for nausea, vomiting, diarrhea, bloody stools GU:  Positives noted in HPI; otherwise negative for gross hematuria, dysuria, urinary incontinence Neuro:  Negative for seizures, poor balance, limb weakness, slurred speech Psych:  Negative for lack of energy, depression, anxiety Endocrine:  Negative for polydipsia, polyuria, symptoms of hypoglycemia (dizziness, hunger, sweating) Hematologic:  Negative for anemia, purpura, petechia, prolonged or excessive bleeding, use of anticoagulants  Allergic:  Negative for difficulty breathing or choking as a result of exposure to anything; no shellfish allergy; no allergic response (rash/itch) to materials, foods  Physical exam: There were no vitals taken for this visit. GENERAL APPEARANCE:  Well appearing, well developed, well nourished, NAD HEENT: Atraumatic, Normocephalic. NECK: Normal appearance LUNGS: Normal inspiratory and expiratory excursion HEART: Regular Rate  EXTREMITIES: Moves all extremities well.  Without clubbing, cyanosis, or edema. NEUROLOGIC:  Alert and oriented x 3, normal gait, CN II-XII grossly intact.  MENTAL STATUS:  Appropriate. SKIN:  Warm, dry and intact.    Results: No results found for this or any previous visit (from the past 24 hours).   I have reviewed referring/prior physicians records--PCP/ER records reviewed  I have reviewed urinalysis  I have reviewed prior urine cultures  I reviewed prior imaging studies--CT/US images reviewed  Assessment: Left renal cyst, small, currently uncharacterized   Plan: I discussed further follow-up with her-continued renal ultrasounds every year or 2, or at the present time proceeding with an MRI for definitive characterization.  I would suggest that.  She is fine with that.  We will get that worked out and I will call with results and follow-up.

## 2024-01-10 ENCOUNTER — Ambulatory Visit (INDEPENDENT_AMBULATORY_CARE_PROVIDER_SITE_OTHER): Payer: BC Managed Care – PPO | Admitting: Urology

## 2024-01-10 VITALS — BP 124/82 | HR 82 | Ht 61.5 in | Wt 165.0 lb

## 2024-01-10 DIAGNOSIS — N201 Calculus of ureter: Secondary | ICD-10-CM

## 2024-01-10 DIAGNOSIS — N281 Cyst of kidney, acquired: Secondary | ICD-10-CM | POA: Diagnosis not present

## 2024-01-10 DIAGNOSIS — N200 Calculus of kidney: Secondary | ICD-10-CM

## 2024-01-10 DIAGNOSIS — N2889 Other specified disorders of kidney and ureter: Secondary | ICD-10-CM

## 2024-01-16 ENCOUNTER — Other Ambulatory Visit: Payer: Self-pay | Admitting: Family

## 2024-01-18 ENCOUNTER — Encounter: Payer: Self-pay | Admitting: Urology

## 2024-02-01 LAB — URINALYSIS
Bilirubin, UA: NEGATIVE
Glucose, UA: NEGATIVE
Ketones, UA: NEGATIVE
Leukocytes,UA: NEGATIVE
Nitrite, UA: NEGATIVE
Protein,UA: NEGATIVE
RBC, UA: NEGATIVE
Specific Gravity, UA: 1.015 (ref 1.005–1.030)
Urobilinogen, Ur: 0.2 mg/dL (ref 0.2–1.0)
pH, UA: 6 (ref 5.0–7.5)

## 2024-02-04 ENCOUNTER — Ambulatory Visit
Admission: RE | Admit: 2024-02-04 | Discharge: 2024-02-04 | Disposition: A | Discharge status: Home / Self Care | Source: Ambulatory Visit | Attending: Urology | Admitting: Urology

## 2024-02-04 DIAGNOSIS — N281 Cyst of kidney, acquired: Secondary | ICD-10-CM

## 2024-02-04 DIAGNOSIS — N2889 Other specified disorders of kidney and ureter: Secondary | ICD-10-CM

## 2024-02-04 DIAGNOSIS — I701 Atherosclerosis of renal artery: Secondary | ICD-10-CM | POA: Diagnosis not present

## 2024-02-04 MED ORDER — GADOPICLENOL 0.5 MMOL/ML IV SOLN
7.0000 mL | Freq: Once | INTRAVENOUS | Status: AC | PRN
  Administered 2024-02-04: 7 mL via INTRAVENOUS

## 2024-02-14 ENCOUNTER — Encounter: Payer: Self-pay | Admitting: Urology

## 2024-04-04 DIAGNOSIS — M79671 Pain in right foot: Secondary | ICD-10-CM | POA: Diagnosis not present

## 2024-04-21 DIAGNOSIS — S99921A Unspecified injury of right foot, initial encounter: Secondary | ICD-10-CM | POA: Diagnosis not present

## 2024-04-22 ENCOUNTER — Other Ambulatory Visit: Payer: Self-pay | Admitting: Family

## 2024-05-09 DIAGNOSIS — D225 Melanocytic nevi of trunk: Secondary | ICD-10-CM | POA: Diagnosis not present

## 2024-05-09 DIAGNOSIS — Z1283 Encounter for screening for malignant neoplasm of skin: Secondary | ICD-10-CM | POA: Diagnosis not present

## 2024-05-17 ENCOUNTER — Telehealth: Admitting: Family Medicine

## 2024-05-17 ENCOUNTER — Encounter: Payer: Self-pay | Admitting: Family

## 2024-05-17 DIAGNOSIS — J019 Acute sinusitis, unspecified: Secondary | ICD-10-CM | POA: Diagnosis not present

## 2024-05-17 DIAGNOSIS — B9689 Other specified bacterial agents as the cause of diseases classified elsewhere: Secondary | ICD-10-CM

## 2024-05-17 MED ORDER — DOXYCYCLINE HYCLATE 100 MG PO TABS: 100.0000 mg | ORAL_TABLET | Freq: Two times a day (BID) | ORAL | 0 refills | Status: AC

## 2024-05-17 MED ORDER — HYDROCHLOROTHIAZIDE 12.5 MG PO CAPS: 12.5000 mg | ORAL_CAPSULE | Freq: Every day | ORAL | 0 refills | Status: DC

## 2024-05-17 MED ORDER — ESTRADIOL 0.5 MG PO TABS: 0.5000 mg | ORAL_TABLET | Freq: Every day | ORAL | 0 refills | Status: DC

## 2024-05-17 NOTE — Progress Notes (Signed)
E-Visit for Sinus Problems  We are sorry that you are not feeling well.  Here is how we plan to help!  Based on what you have shared with me it looks like you have sinusitis.  Sinusitis is inflammation and infection in the sinus cavities of the head.  Based on your presentation I believe you most likely have Acute Bacterial Sinusitis.  This is an infection caused by bacteria and is treated with antibiotics. I have prescribed Doxycycline 100mg by mouth twice a day for 10 days. You may use an oral decongestant such as Mucinex D or if you have glaucoma or high blood pressure use plain Mucinex. Saline nasal spray help and can safely be used as often as needed for congestion.  If you develop worsening sinus pain, fever or notice severe headache and vision changes, or if symptoms are not better after completion of antibiotic, please schedule an appointment with a health care provider.    Sinus infections are not as easily transmitted as other respiratory infection, however we still recommend that you avoid close contact with loved ones, especially the very young and elderly.  Remember to wash your hands thoroughly throughout the day as this is the number one way to prevent the spread of infection!  Home Care: Only take medications as instructed by your medical team. Complete the entire course of an antibiotic. Do not take these medications with alcohol. A steam or ultrasonic humidifier can help congestion.  You can place a towel over your head and breathe in the steam from hot water coming from a faucet. Avoid close contacts especially the very young and the elderly. Cover your mouth when you cough or sneeze. Always remember to wash your hands.  Get Help Right Away If: You develop worsening fever or sinus pain. You develop a severe head ache or visual changes. Your symptoms persist after you have completed your treatment plan.  Make sure you Understand these instructions. Will watch your  condition. Will get help right away if you are not doing well or get worse.  Thank you for choosing an e-visit.  Your e-visit answers were reviewed by a board certified advanced clinical practitioner to complete your personal care plan. Depending upon the condition, your plan could have included both over the counter or prescription medications.  Please review your pharmacy choice. Make sure the pharmacy is open so you can pick up prescription now. If there is a problem, you may contact your provider through MyChart messaging and have the prescription routed to another pharmacy.  Your safety is important to us. If you have drug allergies check your prescription carefully.   For the next 24 hours you can use MyChart to ask questions about today's visit, request a non-urgent call back, or ask for a work or school excuse. You will get an email in the next two days asking about your experience. I hope that your e-visit has been valuable and will speed your recovery.  I provided 5 minutes of non face-to-face time during this encounter for chart review, medication and order placement, as well as and documentation.   

## 2024-05-18 DIAGNOSIS — S99921D Unspecified injury of right foot, subsequent encounter: Secondary | ICD-10-CM | POA: Diagnosis not present

## 2024-05-23 ENCOUNTER — Other Ambulatory Visit: Payer: Self-pay | Admitting: Family

## 2024-05-24 ENCOUNTER — Encounter: Payer: Self-pay | Admitting: Family

## 2024-05-24 DIAGNOSIS — E039 Hypothyroidism, unspecified: Secondary | ICD-10-CM

## 2024-05-25 NOTE — Addendum Note (Signed)
 Addended by: DARYL SETTER on: 05/25/2024 10:45 AM   Modules accepted: Orders

## 2024-06-06 DIAGNOSIS — M79671 Pain in right foot: Secondary | ICD-10-CM | POA: Diagnosis not present

## 2024-06-13 DIAGNOSIS — S99921A Unspecified injury of right foot, initial encounter: Secondary | ICD-10-CM | POA: Diagnosis not present

## 2024-06-15 DIAGNOSIS — M79671 Pain in right foot: Secondary | ICD-10-CM | POA: Diagnosis not present

## 2024-06-21 ENCOUNTER — Telehealth: Admitting: Family Medicine

## 2024-06-21 DIAGNOSIS — J329 Chronic sinusitis, unspecified: Secondary | ICD-10-CM

## 2024-06-21 NOTE — Progress Notes (Signed)
  Because since you failed the treatment in the last month, you will need to follow up in person for this recurrence. We feel your condition warrants further evaluation and recommend that you be seen in a face-to-face visit PCP and or urgent care.   NOTE: There will be NO CHARGE for this E-Visit   If you are having a true medical emergency, please call 911.

## 2024-06-22 ENCOUNTER — Encounter: Payer: Self-pay | Admitting: Student

## 2024-06-22 ENCOUNTER — Ambulatory Visit (INDEPENDENT_AMBULATORY_CARE_PROVIDER_SITE_OTHER): Admitting: Student

## 2024-06-22 VITALS — BP 128/86 | HR 85 | Temp 98.3°F | Resp 12 | Ht 61.5 in | Wt 174.8 lb

## 2024-06-22 DIAGNOSIS — J0100 Acute maxillary sinusitis, unspecified: Secondary | ICD-10-CM | POA: Insufficient documentation

## 2024-06-22 MED ORDER — CEFPODOXIME PROXETIL 200 MG PO TABS: 200.0000 mg | ORAL_TABLET | Freq: Two times a day (BID) | ORAL | 0 refills | Status: AC

## 2024-06-22 MED ORDER — GUAIFENESIN ER 600 MG PO TB12: 1200.0000 mg | ORAL_TABLET | Freq: Two times a day (BID) | ORAL | 0 refills | Status: DC

## 2024-06-22 NOTE — Progress Notes (Signed)
 Chief Complaint  Patient presents with   Sinus Problem    Blowing out green stuff     Tara Hamilton is 63 y.o. female here for possible sinus infection.  She had e visit early in July and was given doxycycline .  Duration: 4 week Symptoms include: nasal congestion, purulent rhinorrhea, facial pain Denies: sniffing, foul breath, frequent clearing of the throat, puffiness of the eyes Treatment to date: Doxycycline  Sick contacts? No  ROS:  Const: Denies fevers HEENT: As noted in HPI  Past Medical History:  Diagnosis Date   Diverticulosis of colon    Frequency of urination    History of adenomatous polyp of colon    History of asthma    as a child   Hyperlipidemia    borderline   Hypertension    Hypothyroidism    followed by pcp   Osteopenia    SUI (stress urinary incontinence, female)    Social History   Socioeconomic History   Marital status: Divorced    Spouse name: Not on file   Number of children: Not on file   Years of education: Not on file   Highest education level: Some college, no degree  Occupational History   Not on file  Tobacco Use   Smoking status: Never   Smokeless tobacco: Never  Vaping Use   Vaping status: Never Used  Substance and Sexual Activity   Alcohol use: Not Currently    Comment: seldom   Drug use: Never   Sexual activity: Not on file  Other Topics Concern   Not on file  Social History Narrative   From here, recently moved back from 20 years in [place].    Works as a IT consultant   2 grown sons one in [place], other son lives in [place]   Single    One grandchild in [place] Radio broadcast assistant)   Enjoys Nutritional therapist, church, reading   Social Drivers of Health   Financial Resource Strain: Low Risk  (06/21/2024)   Overall Financial Resource Strain (CARDIA)    Difficulty of Paying Living Expenses: Not hard at all  Food Insecurity: No Food Insecurity (06/21/2024)   Hunger Vital Sign    Worried About Running Out of Food in the Last  Year: Never true    Ran Out of Food in the Last Year: Never true  Transportation Needs: No Transportation Needs (06/21/2024)   PRAPARE - Administrator, Civil Service (Medical): No    Lack of Transportation (Non-Medical): No  Physical Activity: Insufficiently Active (06/21/2024)   Exercise Vital Sign    Days of Exercise per Week: 1 day    Minutes of Exercise per Session: 20 min  Stress: No Stress Concern Present (06/21/2024)   [employer] of Occupational Health - Occupational Stress Questionnaire    Feeling of Stress: Not at all  Social Connections: Unknown (06/21/2024)   Social Connection and Isolation Panel    Frequency of Communication with Friends and Family: More than three times a week    Frequency of Social Gatherings with Friends and Family: Three times a week    Attends Religious Services: More than 4 times per year    Active Member of Clubs or Organizations: Patient declined    Attends Banker Meetings: Not on file    Marital Status: Divorced  Intimate Partner Violence: Not on file   Family History  Problem Relation Age of Onset   Hypertension Mother    Stroke Mother 78  Peripheral vascular disease Mother    Hypertension Father        died from suicide   Diabetes Sister    Diabetes Brother    Cancer Maternal Aunt 7       uterine   COPD Maternal Grandmother    COPD Maternal Grandfather    Heart disease Paternal Grandmother    Thyroid  disease Neg Hx    Colon cancer Neg Hx    Colon polyps Neg Hx    Crohn's disease Neg Hx    Esophageal cancer Neg Hx    Rectal cancer Neg Hx    Stomach cancer Neg Hx    Ulcerative colitis Neg Hx     BP 128/86 (BP Location: Left Arm, Patient Position: Sitting, Cuff Size: Normal)   Pulse 85   Temp 98.3 F (36.8 C) (Oral)   Resp 12   Ht 5' 1.5 (1.562 m)   Wt 174 lb 12.8 oz (79.3 kg)   SpO2 98%   BMI 32.49 kg/m  Gen- Awake, alert, appears stated age HEENT- Ears patent, TM's neg b/l, nares are patent,  maxillary sinuses are TTP, MMM, pharynx is not erythematous or with exudate Heart- RRR, no murmur, no bruits Lungs- CTAB, no accessory muscle use Psych- Age appropriate judgment and insight, nml mood and affect  Acute non-recurrent maxillary sinusitis - Plan: cefpodoxime  (VANTIN ) 200 MG tablet, guaiFENesin  (MUCINEX ) 600 MG 12 hr tablet  Orders as above. Discussed abx use and that >95% of sinus infections are viral. OTC remedies such as PO antihistamines and INCS spray recommended and listed in AVS. Continue to practice good hand hygiene, push fluids. F/u prn. The patient voiced understanding and agreement to the plan.  [name], DNP, AGNP-C

## 2024-06-27 ENCOUNTER — Ambulatory Visit (HOSPITAL_BASED_OUTPATIENT_CLINIC_OR_DEPARTMENT_OTHER)
Admission: RE | Admit: 2024-06-27 | Discharge: 2024-06-27 | Disposition: A | Discharge status: Home / Self Care | Source: Ambulatory Visit | Attending: Family | Admitting: Family

## 2024-06-27 ENCOUNTER — Other Ambulatory Visit

## 2024-06-27 DIAGNOSIS — Z780 Asymptomatic menopausal state: Secondary | ICD-10-CM | POA: Diagnosis not present

## 2024-06-27 DIAGNOSIS — M8580 Other specified disorders of bone density and structure, unspecified site: Secondary | ICD-10-CM | POA: Diagnosis not present

## 2024-06-28 ENCOUNTER — Ambulatory Visit: Payer: Self-pay | Admitting: Family

## 2024-07-12 ENCOUNTER — Other Ambulatory Visit: Payer: Self-pay | Admitting: Family

## 2024-07-28 ENCOUNTER — Encounter: Payer: Self-pay | Admitting: Family

## 2024-07-28 DIAGNOSIS — E039 Hypothyroidism, unspecified: Secondary | ICD-10-CM

## 2024-07-28 DIAGNOSIS — I10 Essential (primary) hypertension: Secondary | ICD-10-CM

## 2024-07-28 DIAGNOSIS — N951 Menopausal and female climacteric states: Secondary | ICD-10-CM

## 2024-07-30 ENCOUNTER — Telehealth: Payer: Self-pay | Admitting: Family

## 2024-07-30 MED ORDER — ESTRADIOL 0.5 MG PO TABS: 0.5000 mg | ORAL_TABLET | Freq: Every day | ORAL | 0 refills | Status: DC

## 2024-07-30 MED ORDER — HYDROCHLOROTHIAZIDE 12.5 MG PO CAPS: 12.5000 mg | ORAL_CAPSULE | Freq: Every day | ORAL | 0 refills | Status: DC

## 2024-07-30 MED ORDER — LEVOTHYROXINE SODIUM 75 MCG PO TABS: 75.0000 ug | ORAL_TABLET | Freq: Every day | ORAL | 0 refills | Status: AC

## 2024-07-30 NOTE — Telephone Encounter (Signed)
 See mychart.

## 2024-07-30 NOTE — Addendum Note (Signed)
 Addended by: DARYL SETTER on: 07/30/2024 01:52 PM   Modules accepted: Orders

## 2024-08-30 ENCOUNTER — Other Ambulatory Visit: Payer: BC Managed Care – PPO

## 2024-11-07 ENCOUNTER — Encounter: Payer: Self-pay | Admitting: Family

## 2024-11-07 DIAGNOSIS — I10 Essential (primary) hypertension: Secondary | ICD-10-CM

## 2024-11-07 MED ORDER — HYDROCHLOROTHIAZIDE 12.5 MG PO CAPS: 12.5000 mg | ORAL_CAPSULE | Freq: Every day | ORAL | 0 refills | Status: DC

## 2024-11-10 ENCOUNTER — Other Ambulatory Visit: Payer: Self-pay | Admitting: Family

## 2024-11-10 DIAGNOSIS — I10 Essential (primary) hypertension: Secondary | ICD-10-CM

## 2024-11-27 ENCOUNTER — Encounter: Payer: Self-pay | Admitting: *Deleted

## 2024-12-22 ENCOUNTER — Encounter: Admitting: Family

## 2024-12-22 ENCOUNTER — Encounter: Payer: Self-pay | Admitting: Family

## 2024-12-22 ENCOUNTER — Telehealth: Payer: Self-pay | Admitting: Family

## 2024-12-22 VITALS — BP 135/86 | HR 79 | Temp 98.5°F | Resp 16 | Ht 61.5 in | Wt 176.0 lb

## 2024-12-22 DIAGNOSIS — Z0000 Encounter for general adult medical examination without abnormal findings: Secondary | ICD-10-CM

## 2024-12-22 DIAGNOSIS — I10 Essential (primary) hypertension: Secondary | ICD-10-CM

## 2024-12-22 DIAGNOSIS — E785 Hyperlipidemia, unspecified: Secondary | ICD-10-CM

## 2024-12-22 DIAGNOSIS — Z1231 Encounter for screening mammogram for malignant neoplasm of breast: Secondary | ICD-10-CM

## 2024-12-22 DIAGNOSIS — K551 Chronic vascular disorders of intestine: Secondary | ICD-10-CM | POA: Insufficient documentation

## 2024-12-22 DIAGNOSIS — N951 Menopausal and female climacteric states: Secondary | ICD-10-CM

## 2024-12-22 DIAGNOSIS — E039 Hypothyroidism, unspecified: Secondary | ICD-10-CM

## 2024-12-22 LAB — LIPID PANEL
Cholesterol: 236 mg/dL — ABNORMAL HIGH (ref 28–200)
HDL: 62.4 mg/dL
LDL Cholesterol: 146 mg/dL — ABNORMAL HIGH (ref 10–99)
NonHDL: 174.06
Total CHOL/HDL Ratio: 4
Triglycerides: 140 mg/dL (ref 10.0–149.0)
VLDL: 28 mg/dL (ref 0.0–40.0)

## 2024-12-22 LAB — COMPREHENSIVE METABOLIC PANEL WITH GFR
ALT: 18 U/L (ref 3–35)
AST: 15 U/L (ref 5–37)
Albumin: 4.4 g/dL (ref 3.5–5.2)
Alkaline Phosphatase: 81 U/L (ref 39–117)
BUN: 17 mg/dL (ref 6–23)
CO2: 32 meq/L (ref 19–32)
Calcium: 9.6 mg/dL (ref 8.4–10.5)
Chloride: 102 meq/L (ref 96–112)
Creatinine, Ser: 0.76 mg/dL (ref 0.40–1.20)
GFR: 84.02 mL/min
Glucose, Bld: 88 mg/dL (ref 70–99)
Potassium: 4.2 meq/L (ref 3.5–5.1)
Sodium: 143 meq/L (ref 135–145)
Total Bilirubin: 0.3 mg/dL (ref 0.2–1.2)
Total Protein: 6.6 g/dL (ref 6.0–8.3)

## 2024-12-22 LAB — TSH: TSH: 2.4 u[IU]/mL (ref 0.35–5.50)

## 2024-12-22 MED ORDER — HYDROCHLOROTHIAZIDE 12.5 MG PO CAPS: 12.5000 mg | ORAL_CAPSULE | Freq: Every day | ORAL | 4 refills | Status: AC

## 2024-12-22 MED ORDER — ESTRADIOL 0.5 MG PO TABS: 0.5000 mg | ORAL_TABLET | Freq: Every day | ORAL | 4 refills | Status: AC

## 2024-12-22 NOTE — Progress Notes (Signed)
 "  Subjective:     Patient ID: Tara Hamilton, female    DOB: 02/10/1961, 64 y.o.   MRN: [id number]  Chief Complaint  Patient presents with   Annual Exam    HPI  Discussed the use of AI scribe software for clinical note transcription with the patient, who gave verbal consent to proceed.  History of Present Illness Tara Hamilton is a 64 year old female who presents for an annual physical exam and medication follow-up.  She has no specific concerns at this time but is interested in weight management. She inquired about GLP-1 medications for weight loss. She is concerned about her weight, noting her clothes feel tight, although she did not want to know her current weight. She has a history of a torn plantar plate last spring, which limited her ability to walk for six to eight months, but she has since started lifting weights at home.  Her past medical history includes a kidney stone and a cyst in her left kidney. She underwent an MRI and consulted a urologist. MRI completed 3/25 noted the following:   1. The left renal lesions of interest represent minimally complex cysts. Other renal lesions are most consistent with simple and complex cysts. No specific follow-up indicated.   She is currently taking Synthroid  75 mcg daily and feels generally okay on this dose, although she reports feeling tired. She is also on a low dose of estrogen.  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 She has not yet considered retirement.  No current cough, cold symptoms, skin concerns, hearing or vision changes, leg swelling, digestive issues, urinary problems, unusual muscle or joint pain, frequent headaches, or concerns about depression or anxiety. Patient presents today for complete physical.  Immunizations:  declines prevnar Diet: needs improvement Exercise: not regularly Colonoscopy: 2024 Pap Smear: hysterectomy  Mammogram: due Vision: up to  date Dental: up to date      Health Maintenance Due  Topic Date Due   Pneumococcal Vaccine: 50+ Years (2 of 2 - PCV) 12/13/2021    Past Medical History:  Diagnosis Date   Diverticulosis of colon    Frequency of urination    History of adenomatous polyp of colon    History of asthma    as a child   Hyperlipidemia    borderline   Hypertension    Hypothyroidism    followed by pcp   Osteopenia    SUI (stress urinary incontinence, female)     Past Surgical History:  Procedure Laterality Date   BLADDER SUSPENSION N/A 03/01/2023   Procedure: TRANSVAGINAL TAPE (TVT) PROCEDURE;  Surgeon: Marilynne Rosaline SAILOR, MD;  Location: [facility] [place];  Service: Gynecology;  Laterality: N/A;  Total time requested is 1 hour   COLONOSCOPY WITH PROPOFOL   01/25/2023   dr shila   CYSTOSCOPY N/A 03/01/2023   Procedure: CYSTOSCOPY;  Surgeon: Marilynne Rosaline SAILOR, MD;  Location: [facility];  Service: Gynecology;  Laterality: N/A;   LAPAROSCOPY     age 69, ovarian cyst and endometriosis excision with Dr. Rosalynn in [place]   TONSILLECTOMY  1970   TOTAL ABDOMINAL HYSTERECTOMY W/ BILATERAL SALPINGOOPHORECTOMY  1999    Family History  Problem Relation Age of Onset   Hypertension Mother    Stroke Mother 20   Peripheral vascular disease Mother    Hypertension Father        died from suicide   Diabetes Sister  Diabetes Brother    Cancer Maternal Aunt 66       uterine   COPD Maternal Grandmother    COPD Maternal Grandfather    Heart disease Paternal Grandmother    Thyroid  disease Neg Hx    Colon cancer Neg Hx    Colon polyps Neg Hx    Crohn's disease Neg Hx    Esophageal cancer Neg Hx    Rectal cancer Neg Hx    Stomach cancer Neg Hx    Ulcerative colitis Neg Hx     Social History   Socioeconomic History   Marital status: Divorced    Spouse name: Not on file   Number of children: Not on file   Years of education: Not on file   Highest education level: 12th  grade  Occupational History   Not on file  Tobacco Use   Smoking status: Never   Smokeless tobacco: Never  Vaping Use   Vaping status: Never Used  Substance and Sexual Activity   Alcohol use: Not Currently    Comment: seldom   Drug use: Never   Sexual activity: Not on file  Other Topics Concern   Not on file  Social History Narrative   From here, recently moved back from 20 years in [place].    Works as a it consultant   2 grown sons one in [place], other son lives in [place]   Single    One grandchild in [place] radio broadcast assistant)   Enjoys nutritional therapist, church, reading   Social Drivers of Health   Tobacco Use: Low Risk (12/22/2024)   Patient History    Smoking Tobacco Use: Never    Smokeless Tobacco Use: Never    Passive Exposure: Not on file  Financial Resource Strain: Patient Declined (12/21/2024)   Overall Financial Resource Strain (CARDIA)    Difficulty of Paying Living Expenses: Patient declined  Food Insecurity: Patient Declined (12/21/2024)   Epic    Worried About Programme Researcher, Broadcasting/film/video in the Last Year: Patient declined    Barista in the Last Year: Patient declined  Transportation Needs: Patient Declined (12/21/2024)   Epic    Lack of Transportation (Medical): Patient declined    Lack of Transportation (Non-Medical): Patient declined  Physical Activity: Insufficiently Active (12/21/2024)   Exercise Vital Sign    Days of Exercise per Week: 1 day    Minutes of Exercise per Session: 20 min  Stress: No Stress Concern Present (12/21/2024)   [employer] of Occupational Health - Occupational Stress Questionnaire    Feeling of Stress: Not at all  Social Connections: Moderately Integrated (12/21/2024)   Social Connection and Isolation Panel    Frequency of Communication with Friends and Family: More than three times a week    Frequency of Social Gatherings with Friends and Family: Twice a week    Attends Religious Services: More than 4 times per year    Active Member  of [employer] or Organizations: Yes    Attends Banker Meetings: More than 4 times per year    Marital Status: Divorced  Intimate Partner Violence: Not on file  Depression (PHQ2-9): Low Risk (12/22/2024)   Depression (PHQ2-9)    PHQ-2 Score: 0  Alcohol Screen: Not on file  Housing: Patient Declined (12/21/2024)   Epic    Unable to Pay for Housing in the Last Year: Patient declined    Number of Times Moved in the Last Year: Not on file    Homeless  in the Last Year: Patient declined  Utilities: Not on file  Health Literacy: Not on file    Outpatient Medications Prior to Visit  Medication Sig Dispense Refill   Ascorbic Acid (VITAMIN C) 1000 MG tablet Take 1,000 mg by mouth 2 (two) times daily.     Calcium Carbonate-Vitamin D  (TGT CALCIUM DIETARY SUPPLEMENT PO) Take 1 tablet by mouth in the morning and at bedtime.     fexofenadine (ALLEGRA) 180 MG tablet Take 180 mg by mouth at bedtime.     levothyroxine  (SYNTHROID ) 75 MCG tablet Take 1 tablet (75 mcg total) by mouth daily. 90 tablet 0   Multiple Vitamins-Minerals (MULTIVITAMIN ADULT PO) Take 1 tablet by mouth daily.     estradiol  (ESTRACE ) 0.5 MG tablet Take 1 tablet (0.5 mg total) by mouth daily. 90 tablet 0   hydrochlorothiazide  (MICROZIDE ) 12.5 MG capsule Take 1 capsule (12.5 mg total) by mouth daily. 90 capsule 0   celecoxib (CELEBREX) 100 MG capsule Take 100 mg by mouth daily as needed for mild pain (pain score 1-3).     guaiFENesin  (MUCINEX ) 600 MG 12 hr tablet Take 2 tablets (1,200 mg total) by mouth 2 (two) times daily. 30 tablet 0   ipratropium (ATROVENT ) 0.03 % nasal spray Place 2 sprays into both nostrils every 12 (twelve) hours. 30 mL 12   No facility-administered medications prior to visit.    Allergies[1]  Review of Systems  Constitutional:  Negative for weight loss.  HENT:  Negative for congestion and hearing loss.   Eyes:  Negative for blurred vision.  Respiratory:  Negative for cough.   Cardiovascular:   Negative for leg swelling.  Gastrointestinal:  Negative for constipation and diarrhea.  Genitourinary:  Negative for dysuria and frequency.  Musculoskeletal:  Negative for joint pain and myalgias.  Skin:  Negative for rash.  Neurological:  Negative for headaches.  Psychiatric/Behavioral:  Negative for depression. The patient is not nervous/anxious.        Objective:    Physical Exam   BP 135/86 (BP Location: Right Arm, Patient Position: Sitting, Cuff Size: Normal)   Pulse 79   Temp 98.5 F (36.9 C) (Oral)   Resp 16   Ht 5' 1.5 (1.562 m)   Wt 176 lb (79.8 kg)   SpO2 99%   BMI 32.72 kg/m  Wt Readings from Last 3 Encounters:  12/22/24 176 lb (79.8 kg)  06/22/24 174 lb 12.8 oz (79.3 kg)  01/10/24 165 lb (74.8 kg)   Physical Exam  Constitutional: She is oriented to person, place, and time. She appears well-developed and well-nourished. No distress.  HENT:  Head: Normocephalic and atraumatic.  Right Ear: Tympanic membrane and ear canal normal.  Left Ear: Tympanic membrane and ear canal normal.  Mouth/Throat: Oropharynx is clear and moist.  Eyes: Pupils are equal, round, and reactive to light. No scleral icterus.  Neck: Normal range of motion. No thyromegaly present.  Cardiovascular: Normal rate and regular rhythm.   No murmur heard. Pulmonary/Chest: Effort normal and breath sounds normal. No respiratory distress. He has no wheezes. She has no rales. She exhibits no tenderness.  Abdominal: Soft. Bowel sounds are normal. She exhibits no distension and no mass. There is no tenderness. There is no rebound and no guarding.  Musculoskeletal: She exhibits no edema.  Lymphadenopathy:    She has no cervical adenopathy.  Neurological: She is alert and oriented to person, place, and time. She has normal patellar reflexes. She exhibits normal muscle tone. Coordination normal.  Skin: Skin is warm and dry.  Psychiatric: She has a normal mood and affect. Her behavior is normal. Judgment  and thought content normal.             Assessment & Plan:       Assessment & Plan:   Problem List Items Addressed This Visit       Unprioritized   Superior mesenteric artery stenosis   Moderate per MR 01/2024.  Pt is not symptomatic from this.  Will monitor for now.  Consider repeat imaging next year.       Relevant Medications   hydrochlorothiazide  (MICROZIDE ) 12.5 MG capsule   Preventative health care - Primary    Due for pneumonia vaccination. Mammogram is up to date. Vision and dental exams are current. Discussed GLP-1 medications for weight loss, including potential side effects and theoretical risks of thyroid  cancer. - Ordered mammogram. - Discussed GLP-1 medications for weight loss. She declines.        Menopausal syndrome   Continues on low-dose Estradiol . Discussed increased risks of breast cancer and stroke with continued estrogen use, especially after age 65. Prefers to continue due to quality of life considerations and accepts these increased risks. - Continue Estradiol  0.5 mg daily.      Relevant Medications   estradiol  (ESTRACE ) 0.5 MG tablet   Hypothyroid    Well-managed on Levothyroxine  75 mcg daily. Reports fatigue, but no clear etiology identified. - Ordered thyroid  function tests. - Continue Levothyroxine  75 mcg daily.       Relevant Orders   TSH   HTN (hypertension)   BP stable on hydrochlorothiazide  12.5mg  daily.       Relevant Medications   hydrochlorothiazide  (MICROZIDE ) 12.5 MG capsule   Other Visit Diagnoses       Breast cancer screening by mammogram       Relevant Orders   MM 3D SCREENING MAMMOGRAM BILATERAL BREAST     Hyperlipidemia, unspecified hyperlipidemia type       Relevant Medications   hydrochlorothiazide  (MICROZIDE ) 12.5 MG capsule   Other Relevant Orders   Lipid panel   Comp Met (CMET)       Assessment & Plan      I have discontinued Kelaiah H. Ladouceur's ipratropium, celecoxib, and guaiFENesin . I am  also having her maintain her Multiple Vitamins-Minerals (MULTIVITAMIN ADULT PO), Calcium Carbonate-Vitamin D  (TGT CALCIUM DIETARY SUPPLEMENT PO), fexofenadine, vitamin C, levothyroxine , estradiol , and hydrochlorothiazide .  Meds ordered this encounter  Medications   estradiol  (ESTRACE ) 0.5 MG tablet    Sig: Take 1 tablet (0.5 mg total) by mouth daily.    Dispense:  90 tablet    Refill:  4    Supervising Provider:   DOMENICA BLACKBIRD A [[id number]]   hydrochlorothiazide  (MICROZIDE ) 12.5 MG capsule    Sig: Take 1 capsule (12.5 mg total) by mouth daily.    Dispense:  90 capsule    Refill:  4    Supervising Provider:   DOMENICA BLACKBIRD A [[id number]]      [1]  Allergies Allergen Reactions   Penicillins Swelling   "

## 2024-12-22 NOTE — Patient Instructions (Signed)
" °  VISIT SUMMARY: Today, you came in for your annual physical exam and medication follow-up. You mentioned concerns about weight management and inquired about GLP-1 medications for weight loss. You also discussed your history of a torn plantar plate, kidney stone, and cyst in your left kidney. You are currently taking Synthroid  and a low dose of estrogen. You reported feeling generally okay but noted some fatigue. We reviewed your current medications and discussed your overall health maintenance.  YOUR PLAN: -HYPOTHYROIDISM: Hypothyroidism is a condition where your thyroid  gland does not produce enough thyroid  hormone. Your condition is well-managed with Levothyroxine  75 mcg daily, but you reported feeling tired. We have ordered thyroid  function tests to further evaluate this. Please continue taking Levothyroxine  75 mcg daily.  -PRIMARY HYPERTENSION: Primary hypertension is high blood pressure without a known secondary cause. You should continue your current antihypertensive regimen to manage your blood pressure.  -HYPERLIPIDEMIA: Hyperlipidemia is a condition where there are high levels of fats (lipids) in your blood. We have ordered a lipid panel to evaluate your cholesterol levels.  -MENOPAUSAL SYNDROME: Menopausal syndrome includes symptoms related to menopause, such as hot flashes and mood changes. You are currently taking a low dose of Estradiol . We discussed the increased risks of breast cancer and stroke with continued estrogen use, especially after age 1. You prefer to continue due to quality of life considerations. Please continue taking Estradiol  0.5 mg daily.  -KIDNEY STONE: A kidney stone is a hard deposit made of minerals and salts that form inside your kidneys. You have a history of a kidney stone and a cyst in your left kidney, with a family history of kidney stones. We have ordered kidney function tests to monitor your kidney health.  -GENERAL HEALTH MAINTENANCE: You are due for a  pneumonia vaccination. Your mammogram, vision, and dental exams are up to date. We discussed GLP-1 medications for weight loss, including potential side effects and theoretical risks of thyroid  cancer.  INSTRUCTIONS: Please follow up with the ordered thyroid  function tests, lipid panel, and kidney function tests. Continue taking your current medications as prescribed. Consider the pneumonia vaccination. If you have any questions or concerns, please do not hesitate to contact our office.    Contains text generated by [employer]bridge.   "

## 2024-12-22 NOTE — Assessment & Plan Note (Signed)
" °  Well-managed on Levothyroxine  75 mcg daily. Reports fatigue, but no clear etiology identified. - Ordered thyroid  function tests. - Continue Levothyroxine  75 mcg daily.  "

## 2024-12-22 NOTE — Assessment & Plan Note (Addendum)
 Moderate per MR 01/2024.  Pt is not symptomatic from this.  Will monitor for now.  Consider repeat imaging next year.

## 2024-12-22 NOTE — Assessment & Plan Note (Signed)
 BP stable on hydrochlorothiazide  12.5mg  daily.

## 2024-12-22 NOTE — Telephone Encounter (Signed)
 See mychart

## 2024-12-22 NOTE — Assessment & Plan Note (Signed)
 Continues on low-dose Estradiol . Discussed increased risks of breast cancer and stroke with continued estrogen use, especially after age 64. Prefers to continue due to quality of life considerations and accepts these increased risks. - Continue Estradiol  0.5 mg daily.

## 2024-12-22 NOTE — Assessment & Plan Note (Signed)
" °  Due for pneumonia vaccination. Mammogram is up to date. Vision and dental exams are current. Discussed GLP-1 medications for weight loss, including potential side effects and theoretical risks of thyroid  cancer. - Ordered mammogram. - Discussed GLP-1 medications for weight loss. She declines.   "

## 2025-12-26 ENCOUNTER — Encounter: Admitting: Family
